# Patient Record
Sex: Female | Born: 1955
Health system: Southern US, Community
[De-identification: ages and names within clinical notes are randomized; demographics above are authoritative.]

## PROBLEM LIST (undated history)

## (undated) DIAGNOSIS — E785 Hyperlipidemia, unspecified: Secondary | ICD-10-CM

## (undated) DIAGNOSIS — J45909 Unspecified asthma, uncomplicated: Secondary | ICD-10-CM

## (undated) DIAGNOSIS — I10 Essential (primary) hypertension: Secondary | ICD-10-CM

## (undated) HISTORY — PX: CHOLECYSTECTOMY: SHX55

## (undated) HISTORY — PX: ABDOMINAL HYSTERECTOMY: SHX81

---

## 1997-11-19 ENCOUNTER — Inpatient Hospital Stay (HOSPITAL_COMMUNITY): Admission: RE | Admit: 1997-11-19 | Discharge: 1997-11-21 | Payer: Self-pay | Admitting: Obstetrics and Gynecology

## 1999-06-16 ENCOUNTER — Other Ambulatory Visit: Admission: RE | Admit: 1999-06-16 | Discharge: 1999-06-16 | Payer: Self-pay | Admitting: Obstetrics and Gynecology

## 1999-11-25 ENCOUNTER — Encounter: Payer: Self-pay | Admitting: Obstetrics and Gynecology

## 1999-11-25 ENCOUNTER — Ambulatory Visit (HOSPITAL_COMMUNITY): Admission: RE | Admit: 1999-11-25 | Discharge: 1999-11-25 | Payer: Self-pay | Admitting: Obstetrics and Gynecology

## 1999-12-02 ENCOUNTER — Other Ambulatory Visit: Admission: RE | Admit: 1999-12-02 | Discharge: 1999-12-02 | Payer: Self-pay | Admitting: Obstetrics and Gynecology

## 2000-06-20 ENCOUNTER — Other Ambulatory Visit: Admission: RE | Admit: 2000-06-20 | Discharge: 2000-06-20 | Payer: Self-pay | Admitting: Obstetrics and Gynecology

## 2000-11-28 ENCOUNTER — Encounter: Payer: Self-pay | Admitting: Obstetrics and Gynecology

## 2000-11-28 ENCOUNTER — Ambulatory Visit (HOSPITAL_COMMUNITY): Admission: RE | Admit: 2000-11-28 | Discharge: 2000-11-28 | Payer: Self-pay | Admitting: Obstetrics and Gynecology

## 2001-07-24 ENCOUNTER — Other Ambulatory Visit: Admission: RE | Admit: 2001-07-24 | Discharge: 2001-07-24 | Payer: Self-pay | Admitting: Obstetrics and Gynecology

## 2001-12-04 ENCOUNTER — Ambulatory Visit (HOSPITAL_COMMUNITY): Admission: RE | Admit: 2001-12-04 | Discharge: 2001-12-04 | Payer: Self-pay | Admitting: Obstetrics and Gynecology

## 2001-12-04 ENCOUNTER — Encounter: Payer: Self-pay | Admitting: Obstetrics and Gynecology

## 2002-12-12 ENCOUNTER — Ambulatory Visit (HOSPITAL_COMMUNITY): Admission: RE | Admit: 2002-12-12 | Discharge: 2002-12-12 | Payer: Self-pay | Admitting: Obstetrics and Gynecology

## 2002-12-12 ENCOUNTER — Encounter: Payer: Self-pay | Admitting: Obstetrics and Gynecology

## 2003-02-10 ENCOUNTER — Encounter (INDEPENDENT_AMBULATORY_CARE_PROVIDER_SITE_OTHER): Payer: Self-pay | Admitting: Specialist

## 2003-02-10 ENCOUNTER — Ambulatory Visit (HOSPITAL_COMMUNITY): Admission: RE | Admit: 2003-02-10 | Discharge: 2003-02-10 | Payer: Self-pay | Admitting: Gastroenterology

## 2003-02-18 ENCOUNTER — Encounter (INDEPENDENT_AMBULATORY_CARE_PROVIDER_SITE_OTHER): Payer: Self-pay | Admitting: Specialist

## 2003-02-18 ENCOUNTER — Ambulatory Visit (HOSPITAL_COMMUNITY): Admission: RE | Admit: 2003-02-18 | Discharge: 2003-02-18 | Payer: Self-pay | Admitting: Gastroenterology

## 2004-01-08 ENCOUNTER — Ambulatory Visit (HOSPITAL_COMMUNITY): Admission: RE | Admit: 2004-01-08 | Discharge: 2004-01-08 | Payer: Self-pay | Admitting: Family Medicine

## 2004-06-07 ENCOUNTER — Ambulatory Visit (HOSPITAL_COMMUNITY): Admission: RE | Admit: 2004-06-07 | Discharge: 2004-06-07 | Payer: Self-pay | Admitting: Gastroenterology

## 2004-06-07 ENCOUNTER — Encounter (INDEPENDENT_AMBULATORY_CARE_PROVIDER_SITE_OTHER): Payer: Self-pay | Admitting: Specialist

## 2005-02-08 ENCOUNTER — Ambulatory Visit (HOSPITAL_COMMUNITY): Admission: RE | Admit: 2005-02-08 | Discharge: 2005-02-08 | Payer: Self-pay | Admitting: Family Medicine

## 2005-09-23 ENCOUNTER — Ambulatory Visit (HOSPITAL_COMMUNITY): Admission: RE | Admit: 2005-09-23 | Discharge: 2005-09-23 | Payer: Self-pay | Admitting: Emergency Medicine

## 2006-02-14 ENCOUNTER — Ambulatory Visit (HOSPITAL_COMMUNITY): Admission: RE | Admit: 2006-02-14 | Discharge: 2006-02-14 | Payer: Self-pay | Admitting: Family Medicine

## 2006-08-08 ENCOUNTER — Ambulatory Visit (HOSPITAL_COMMUNITY): Admission: RE | Admit: 2006-08-08 | Discharge: 2006-08-08 | Payer: Self-pay | Admitting: Gastroenterology

## 2006-08-08 ENCOUNTER — Encounter (INDEPENDENT_AMBULATORY_CARE_PROVIDER_SITE_OTHER): Payer: Self-pay | Admitting: *Deleted

## 2006-09-29 ENCOUNTER — Ambulatory Visit (HOSPITAL_COMMUNITY): Admission: RE | Admit: 2006-09-29 | Discharge: 2006-09-29 | Payer: Self-pay | Admitting: Allergy and Immunology

## 2006-12-22 ENCOUNTER — Ambulatory Visit (HOSPITAL_COMMUNITY): Admission: RE | Admit: 2006-12-22 | Discharge: 2006-12-22 | Payer: Self-pay | Admitting: Family Medicine

## 2007-03-07 ENCOUNTER — Ambulatory Visit (HOSPITAL_COMMUNITY): Admission: RE | Admit: 2007-03-07 | Discharge: 2007-03-07 | Payer: Self-pay | Admitting: Family Medicine

## 2007-05-11 ENCOUNTER — Encounter (INDEPENDENT_AMBULATORY_CARE_PROVIDER_SITE_OTHER): Payer: Self-pay | Admitting: Gastroenterology

## 2007-05-11 ENCOUNTER — Ambulatory Visit (HOSPITAL_COMMUNITY): Admission: RE | Admit: 2007-05-11 | Discharge: 2007-05-11 | Payer: Self-pay | Admitting: Gastroenterology

## 2008-05-16 ENCOUNTER — Ambulatory Visit (HOSPITAL_COMMUNITY): Admission: RE | Admit: 2008-05-16 | Discharge: 2008-05-16 | Payer: Self-pay | Admitting: Family Medicine

## 2009-04-04 ENCOUNTER — Emergency Department (HOSPITAL_COMMUNITY): Admission: EM | Admit: 2009-04-04 | Discharge: 2009-04-04 | Payer: Self-pay | Admitting: Family Medicine

## 2009-05-29 ENCOUNTER — Ambulatory Visit (HOSPITAL_COMMUNITY): Admission: RE | Admit: 2009-05-29 | Discharge: 2009-05-29 | Payer: Self-pay | Admitting: Family Medicine

## 2010-07-21 ENCOUNTER — Ambulatory Visit (HOSPITAL_COMMUNITY): Admission: RE | Admit: 2010-07-21 | Discharge: 2010-07-21 | Payer: Self-pay | Admitting: Family Medicine

## 2010-08-03 ENCOUNTER — Encounter: Admission: RE | Admit: 2010-08-03 | Discharge: 2010-08-03 | Payer: Self-pay | Admitting: Family Medicine

## 2010-11-06 ENCOUNTER — Encounter: Payer: Self-pay | Admitting: Family Medicine

## 2010-11-08 ENCOUNTER — Encounter: Payer: Self-pay | Admitting: Family Medicine

## 2011-03-01 NOTE — Op Note (Signed)
NAMEZALA, DEGRASSE             ACCOUNT NO.:  [id number]   MEDICAL RECORD NO.:  [id number]          PATIENT TYPE:  AMB   LOCATION:  ENDO                         FACILITY:  [facility]   PHYSICIAN:  Petra Kuba, M.D.    DATE OF BIRTH:  Apr 04, 1956   DATE OF PROCEDURE:  05/11/2007  DATE OF DISCHARGE:                               OPERATIVE REPORT   PROCEDURE:  Colonoscopy with biopsy.   INDICATION:  The patient with personal history of carcinoid rectal tiny  tumor, family history of colon polyps in a brother at a young age.  Consent was signed after risks, benefits, methods, options thoroughly  discussed multiple times in the past.   MEDICINES USED FOR THIS PROCEDURE:  1. Fentanyl 125 mcg.  2. Versed 10 mg.   PROCEDURE:  Rectal inspection was pertinent for external hemorrhoids,  small.  Digital exam was negative.  The video colonoscope was inserted,  fairly easily advanced around the colon to the cecum.  This did require  some abdominal pressure but no position changes.  No abnormalities were  seen on insertion.  The cecum was identified by the appendiceal orifice  and the ileocecal valve.  In fact, the scope was inserted a short ways  into the terminal ileum which was normal.  Photodocumentation was  obtained.  The scope was slowly withdrawn.  The prep was adequate.  There was some liquid stool that required washing and suctioning.  On  slow withdrawal through the colon, no abnormalities were seen until we  withdrew to the proximal sigmoid where 2 tiny hyperplastic-appearing  polyps were seen and which were cold biopsied x1 and put in the second  container.  The scope was further withdrawn back to the rectum.  Scars  from previous polypectomy were seen but no other abnormalities.  Anorectal pull-through and retroflexion confirmed some small  hemorrhoids.  The scope was straightened and readvanced a short ways up  the left side of the colon; air was suctioned and scope removed.   The  patient tolerated the procedure well.  There was no obvious immediate  complication.   ENDOSCOPIC DIAGNOSES:  1. Internal-external small hemorrhoids.  2. Two tiny proximal sigmoid polyps, cold biopsied, hyperplastic-      appearing.  3. Otherwise within normal limits to the terminal ileum.   PLAN:  1. Await pathology, probably recheck in 5 years.  2. Continue workup with an EGD and Bravo pH capsule placement.           ______________________________  Petra Kuba, M.D.     MEM/MEDQ  D:  05/11/2007  T:  05/11/2007  Job:  [id number]   cc:   Otilio Connors. Gerri Spore, M.D.  Fax: [phone]   Micah Noel, [facility]

## 2011-03-01 NOTE — Op Note (Signed)
Kristy Colon, Kristy Colon             ACCOUNT NO.:  [id number]   MEDICAL RECORD NO.:  [id number]          PATIENT TYPE:  AMB   LOCATION:  ENDO                         FACILITY:  [facility]   PHYSICIAN:  Petra Kuba, M.D.    DATE OF BIRTH:  1955-12-22   DATE OF PROCEDURE:  DATE OF DISCHARGE:                               OPERATIVE REPORT   PROCEDURE:  Esophagogastroduodenoscopy with BRAVO pH capsule placement.   INDICATIONS:  Atypical reflux.   Consent was signed after risks, benefits, methods, options were  thoroughly discussed in the office.  Additional medicines for this  procedure, since it followed the colonoscopy, were fentanyl 25  micrograms, Versed 6 mg.   DESCRIPTION OF PROCEDURE:  The video endoscope was inserted by direct  vision.  The esophagus was normal.  She did have a small hiatal hernia.  Scope passed into the stomach, advanced to a normal antrum, normal, and  into a normal duodenal bulb and around the C-loop, past a normal ampulla  into the normal second portion of the duodenum.  The scope was slowly  withdrawn back to the bulb, which confirmed its normal appearance.  Scope was withdrawn back to the stomach, which was evaluated on straight  and retroflexed visualization, the hiatal hernia being confirmed in the  cardia.  The fundus, angularis, lesser and  greater curve were all  normal.  Straight visualization did not reveal any additional findings.  The scope was slowly withdrawn back to the upper esophageal sphincter.  The landmarks in the esophagus were confirmed.  We went ahead and  readvanced to the GE junction, again confirming the normal esophagus.  We went ahead and took our measurements.  The GE junction was right at  about 38 cm, so we marked the BRAVO capsule at 32 cm.  The scope was  removed.  The BRAVO capsule was placed by indirect vision in the  customary fashion.  Initially, it was probably placed in her lung, since  we induced coughing, and once that  occurred, it was quickly removed.  We  then replaced it seemingly into the esophagus and it was released in  customary fashion.  The scope was reinserted, which confirmed the proper  position, approximately 30 cm in the esophagus endoscopically.  The  scope was removed.  The patient tolerated the procedure well.  There was  no obvious immediate complication, although the BRAVO capsule may have  been placed into her lung, or at least into the larynx, which caused  some coughing for one short time.           ______________________________  Petra Kuba, M.D.     MEM/MEDQ  D:  05/11/2007  T:  05/11/2007  Job:  [id number]

## 2011-03-04 NOTE — Op Note (Signed)
Kristy Colon, Kristy Colon                       ACCOUNT NO.:  [id number]   MEDICAL RECORD NO.:  [id number]                   PATIENT TYPE:  AMB   LOCATION:  ENDO                                 FACILITY:  [facility]   PHYSICIAN:  Petra Kuba, M.D.                 DATE OF BIRTH:  08-25-1956   DATE OF PROCEDURE:  02/10/2003  DATE OF DISCHARGE:                                 OPERATIVE REPORT   PROCEDURE:  Colonoscopy with hot biopsy.   INDICATIONS FOR PROCEDURE:  Bright red blood per rectum, family history of  colon polyps. Consent was signed after the risks, benefits, methods and  options were thoroughly discussed in the office.   MEDICATIONS:  1. Demerol 100.  2. Versed 10.   DESCRIPTION OF PROCEDURE:  A rectal inspection was pertinent for external  hemorrhoids. A small digital examination was negative. The video adjustable  colonoscope was inserted and advanced around the colon to the cecum. This  did require rolling her on her back and some abdominal pressure.   No obvious abnormality was seen on insertion. The cecum was identified by  the appendiceal orifice and the ileocecal valve. The prep was fairly  adequate. She did require some washing and suctioning for adequate  visualization.   On slow withdrawal through the colon the cecum, ascending, transverse and  descending colon were normal. At the descending sigmoid junction a small  polyp was seen  and was hot biopsied x1. Also near the distal sigmoid  another small polyp was seen  and was hot biopsied x1 and in the rectum a  third tiny one was hot biopsied. All polyps were put in the same container.   The scope was retroflexed and pertinent for some internal hemorrhoids. The  scope was straightened and readvanced a short way and sent to the left side  of the colon. Air was suctioned and the scope was removed.   The patient tolerated the procedure well. There were no obvious immediate  complications.   ENDOSCOPIC  DIAGNOSES:  1. Internal and external hemorrhoids.  2. Three small rectal and sigmoid polyps, hot biopsied.  3. Otherwise within normal limits to the cecum.   PLAN:  1. Await pathology to return to plan future colonic screening.  2.     Treat the hemorrhoids, causes of bleeding.  3. Have her to see me back p.r.n. otherwise return to Dr. Clyde Canterbury for the     customary health maintenance including yearly rectals and Guaiacs.                                               Petra Kuba, M.D.    MEM/MEDQ  D:  02/10/2003  T:  02/10/2003  Job:  [id number]   cc:   Otilio Connors.  Gerri Spore, M.D.  [address]  [place]  [place] [address]  Fax: [phone]

## 2011-03-04 NOTE — Op Note (Signed)
Kristy Colon, LANINGHAM             ACCOUNT NO.:  [id number]   MEDICAL RECORD NO.:  [id number]          PATIENT TYPE:  AMB   LOCATION:  ENDO                         FACILITY:  [facility]   PHYSICIAN:  Petra Kuba, M.D.    DATE OF BIRTH:  06/07/1956   DATE OF PROCEDURE:  08/08/2006  DATE OF DISCHARGE:                                 OPERATIVE REPORT   PROCEDURE:  Esophagogastroduodenoscopy with biopsy.   INDICATIONS:  Questionable atypical reflux.   CONSENT:  Consent was signed after risks, benefits, methods, options  thoroughly discussed in the office.   MEDICATIONS:  Fentanyl 100 mcg, Versed 8 mg.   PROCEDURE:  The video endoscope was inserted by direct vision.  Esophagus  was normal.  Quick look at the vocal cords was normal.  Scope passed in the  stomach where a little bit of old food was seen and advanced through a  normal antrum, normal pylorus and into a normal duodenal bulb and around the  C-loop to a normal second portion of the duodenum.  The scope was slowly  withdrawn, again a good look at the bulb was normal.  Scope was drawn back  to stomach and retroflexed.  Cardia, fundus, angularis, lesser and greater  curve were all normal except for the area underneath the proximal greater  curve that had a little bit of food.  We could wash and suction some of it.  No other abnormalities were seen.  Straight visualization of the stomach did  not reveal any additional findings.  Air was suctioned. Scope was slowly  withdrawn.  Again a good look at the esophagus was normal.  No hiatal hernia  was seen.  Went ahead and took a few biopsies of the distal and mid-  esophagus to rule out any microscopic reflux changes.  Scope was further  withdrawn.  No other abnormalities were seen.  The patient tolerated the  procedure well.  There was no obvious immediate complication.   ENDOSCOPIC DIAGNOSES:  1. Patent gastroesophageal junction without obvious hiatal hernia.  2. Little bit of old  food in the stomach.  3. Otherwise normal esophagogastroduodenoscopy, status post esophageal      biopsies to rule out microscope reflux changes.   PLAN:  Await pathology.  Consider a gastric emptying study next and possibly  a 24-hour pH off of medicines. Bravo capsule to confirm the diagnosis.           ______________________________  Petra Kuba, M.D.     MEM/MEDQ  D:  08/08/2006  T:  08/09/2006  Job:  [id number]   cc:   Otilio Connors. Gerri Spore, M.D.

## 2011-03-04 NOTE — Op Note (Signed)
NAMEHELAINA, STEFANO             ACCOUNT NO.:  [id number]   MEDICAL RECORD NO.:  [id number]          PATIENT TYPE:  AMB   LOCATION:  ENDO                         [facility]ACILITY:  [facility]   PHYSICIAN:  Petra Kuba, M.D.    DATE OF BIRTH:  18-Mar-1956   DATE OF PROCEDURE:  DATE OF DISCHARGE:  05/11/2007                               OPERATIVE REPORT   PROCEDURE:  Bravo pH capsule.   INDICATIONS:  Patient with atypical reflux, want to evaluate.   PROCEDURE:  The Bravo capsule was placed at the time of endoscopy and  the computer-generated results were given to me for my interpretation.   ASSESSMENT:  On day one, she had just a slightly high total DeMeester  score of 19.1 with normal being less than 14.72.  Interesting, most of  her reflux was in the upright position with very little in the supine.  Interesting, on day two of the monitor, she had a normal amount of  reflux with a DeMeester total score of 13.3, although I do not think she  was taking her Nexium, she did not stop for three days.  Her total  fraction of the pH less than 4 was in the normal range with a total of  5.3%.  As far as the _________ of symptoms and reflux, there did seem to  be some concordance.   IMPRESSION:  Probable mild reflux overall with some concordant symptoms.   PLAN:  We will continue pump inhibitors.  Although I am not sure yet if  fixing her hiatal hernia will decrease some of her atypical symptoms, we  can discuss this on followup.           ______________________________  Petra Kuba, M.D.     MEM/MEDQ  D:  05/15/2007  T:  05/16/2007  Job:  [id number]

## 2011-03-04 NOTE — Op Note (Signed)
Kristy Colon, Kristy Colon                       ACCOUNT NO.:  [id number]   MEDICAL RECORD NO.:  [id number]                   PATIENT TYPE:  AMB   LOCATION:  ENDO                                 FACILITY:  [facility]   PHYSICIAN:  Petra Kuba, M.D.                 DATE OF BIRTH:  12-09-1955   DATE OF PROCEDURE:  02/18/2003  DATE OF DISCHARGE:                                 OPERATIVE REPORT   PROCEDURE:  Flexible sigmoidoscopy with hot biopsy.   INDICATIONS:  Patient with three tiny to small polyps on recent colonoscopy,  one pertinent for being a small carcinoid.  Want to re-evaluate her  polypectomy sites and re-biopsy them to confirm complete excision.  Consent  was signed after risks, benefits, methods, and options thoroughly discussed  by both my nurse and I on the phone.   MEDICATIONS:  Demerol 80 mg, Versed 8 mg.   DESCRIPTION OF PROCEDURE:  Rectal inspection was pertinent for small  external hemorrhoids.  Digital exam was negative.  The pediatric video  adjustable colonoscope was inserted and easily advanced to 70 cm.  On  insertion the distal rectal and rectosigmoid junction polypectomy sites were  seen, but the third one was not seen.  At 70 cm it appeared that we were at  the splenic flexure.  We elected to withdraw.  The prep was adequate.  There  was some liquid stool that required washing and suctioning.  At the sigmoid-  descending junction, a possible polypectomy site was seen with a tiny little  bit of erythema, and it was hot biopsied x1.  Retrospectively, as we  withdrew this was probably not a previous polypectomy site.  On slow  withdrawal through the sigmoid, a tiny polyp was seen and it was hot  biopsied as well and put in the second container.  We then withdrew through  the midsigmoid.  An obvious polypectomy site was seen.  There was a nice  white coagulum without any obvious polyp remaining, and we took three hot  biopsies of the edge of this and put them in  the third container.  At the  rectosigmoid junction one of the polypectomy sites previously seen was hot  biopsied x3 and put in the fourth container.  The scope was withdrawn to the  distal rectal polypectomy site and was hot biopsied x2.  No obvious residual  polyp was seen on this one as well.  The scope was retroflexed, revealing  some internal hemorrhoids.  We could see the most distal polypectomy site on  retroflexion.  The scope was straightened and readvanced a short way up the  left side of the colon, air was suctioned, scope removed.  The patient  tolerated the procedure well.  There was no obvious immediate complication.   ENDOSCOPIC DIAGNOSES:  1. Internal and external hemorrhoids.  2. All three polypectomy sites found without obvious residual polyp, all hot  biopsied.  3. Questionable two other descending and sigmoid tiny polyps, which were     also hot biopsied and put in separate containers as well.  4. Otherwise within normal limits to approximately the splenic flexure.    PLAN:  1. Await pathology to determine future colonic screening.  2. Follow up p.r.n. or in two months to recheck symptoms and make sure no     further workup plans are needed.                                               Petra Kuba, M.D.    MEM/MEDQ  D:  02/18/2003  T:  02/18/2003  Job:  [id number]   cc:   Otilio Connors. Gerri Spore, M.D.  [address]  [place]  [place] [address]  Fax: [phone]

## 2011-03-04 NOTE — Op Note (Signed)
NAMEHIBO, BLASDELL                       ACCOUNT NO.:  [id number]   MEDICAL RECORD NO.:  [id number]                   PATIENT TYPE:  AMB   LOCATION:  ENDO                                 FACILITY:  [facility]   PHYSICIAN:  Petra Kuba, M.D.                 DATE OF BIRTH:  1956-10-16   DATE OF PROCEDURE:  06/07/2004  DATE OF DISCHARGE:                                 OPERATIVE REPORT   PROCEDURE:  Colonoscopy.   INDICATIONS FOR PROCEDURE:  Patient with carcinoid tumor on previous  colonoscopy with colon polyps.  Consent was signed after risks, benefits,  methods, and options were thoroughly discussed in the office on multiple  occasions.   MEDICATIONS USED:  Demerol 100, Versed 8.   PROCEDURE:  Rectal inspection was pertinent for external hemorrhoids.  Digital exam was negative.  The video pediatric adjustable colonoscope was  inserted and with abdominal pressure, able to be advanced through the colon  to the cecum.  This did not require any position changes.  No abnormality  was seen on insertion.  The scope was inserted a short ways into the  terminal ileum which was normal.  Photodocumentation was obtained.  The  scope was slowly withdrawn.  On slow withdrawal through the colon, the prep  was adequate, there was some liquid stool that required washing and  suctioning.  The cecum, ascending, transverse, and descending were normal.  The scope was withdrawn around the sigmoid.  A few tiny probably  hyperplastic appearing polyps were seen and were all hot biopsied.  Two in  the right colon were hot biopsied, as well.  No significant lesions were  seen.  Anal and rectal pull through and retroflexion confirmed small  hemorrhoids.  The scope was reinserted a short ways up the left side of the  colon, air was suctioned, the scope was removed.  The patient tolerated the  procedure well.  There was no obvious complications.   ENDOSCOPIC DIAGNOSIS:  1. Internal and external  hemorrhoids.  2. A few tiny probable hyperplastic appearing rectal and sigmoid polyps, hot     biopsied.  3. Otherwise, within normal limits to the terminal ileum.   PLAN:  Await pathology to determine future colonic screening.  Happy to see  back p.r.n., otherwise, return care to Dr. Gerri Spore for the customary  health care maintenance to include yearly rectals and guaiacs.                                               Petra Kuba, M.D.    MEM/MEDQ  D:  06/07/2004  T:  06/07/2004  Job:  [id number]

## 2011-08-22 ENCOUNTER — Other Ambulatory Visit (HOSPITAL_COMMUNITY): Payer: Self-pay | Admitting: Family Medicine

## 2011-08-22 DIAGNOSIS — Z1231 Encounter for screening mammogram for malignant neoplasm of breast: Secondary | ICD-10-CM

## 2011-09-14 ENCOUNTER — Other Ambulatory Visit: Payer: Self-pay | Admitting: Obstetrics and Gynecology

## 2011-09-20 ENCOUNTER — Ambulatory Visit (HOSPITAL_COMMUNITY)
Admission: RE | Admit: 2011-09-20 | Discharge: 2011-09-20 | Disposition: A | Discharge status: Home / Self Care | Payer: 59 | Source: Ambulatory Visit | Attending: Family Medicine | Admitting: Family Medicine

## 2011-09-20 DIAGNOSIS — Z1231 Encounter for screening mammogram for malignant neoplasm of breast: Secondary | ICD-10-CM | POA: Insufficient documentation

## 2011-09-26 ENCOUNTER — Other Ambulatory Visit: Payer: Self-pay | Admitting: Family Medicine

## 2011-09-26 DIAGNOSIS — R928 Other abnormal and inconclusive findings on diagnostic imaging of breast: Secondary | ICD-10-CM

## 2011-10-13 ENCOUNTER — Ambulatory Visit
Admission: RE | Admit: 2011-10-13 | Discharge: 2011-10-13 | Disposition: A | Discharge status: Home / Self Care | Payer: 59 | Source: Ambulatory Visit | Attending: Family Medicine | Admitting: Family Medicine

## 2011-10-13 DIAGNOSIS — R928 Other abnormal and inconclusive findings on diagnostic imaging of breast: Secondary | ICD-10-CM

## 2012-05-18 ENCOUNTER — Observation Stay (HOSPITAL_COMMUNITY)
Admission: AD | Admit: 2012-05-18 | Discharge: 2012-05-19 | Disposition: A | Discharge status: Home / Self Care | Payer: 59 | Source: Ambulatory Visit | Attending: Emergency Medicine | Admitting: Emergency Medicine

## 2012-05-18 ENCOUNTER — Encounter (HOSPITAL_COMMUNITY): Payer: Self-pay

## 2012-05-18 ENCOUNTER — Inpatient Hospital Stay (HOSPITAL_COMMUNITY): Payer: 59

## 2012-05-18 DIAGNOSIS — R0602 Shortness of breath: Secondary | ICD-10-CM | POA: Insufficient documentation

## 2012-05-18 DIAGNOSIS — J45909 Unspecified asthma, uncomplicated: Secondary | ICD-10-CM | POA: Insufficient documentation

## 2012-05-18 DIAGNOSIS — R079 Chest pain, unspecified: Principal | ICD-10-CM | POA: Insufficient documentation

## 2012-05-18 DIAGNOSIS — R42 Dizziness and giddiness: Secondary | ICD-10-CM | POA: Insufficient documentation

## 2012-05-18 DIAGNOSIS — R110 Nausea: Secondary | ICD-10-CM | POA: Insufficient documentation

## 2012-05-18 DIAGNOSIS — E785 Hyperlipidemia, unspecified: Secondary | ICD-10-CM | POA: Insufficient documentation

## 2012-05-18 HISTORY — DX: Hyperlipidemia, unspecified: E78.5

## 2012-05-18 HISTORY — DX: Unspecified asthma, uncomplicated: J45.909

## 2012-05-18 HISTORY — DX: Essential (primary) hypertension: I10

## 2012-05-18 LAB — CBC WITH DIFFERENTIAL/PLATELET
Basophils Absolute: 0 10*3/uL (ref 0.0–0.1)
Basophils Relative: 0 % (ref 0–1)
Eosinophils Absolute: 0.5 10*3/uL (ref 0.0–0.7)
Eosinophils Relative: 5 % (ref 0–5)
HCT: 37.2 % (ref 36.0–46.0)
Hemoglobin: 13.3 g/dL (ref 12.0–15.0)
Lymphocytes Relative: 23 % (ref 12–46)
Lymphs Abs: 2 10*3/uL (ref 0.7–4.0)
MCH: 32.7 pg (ref 26.0–34.0)
MCHC: 35.8 g/dL (ref 30.0–36.0)
MCV: 91.4 fL (ref 78.0–100.0)
Monocytes Absolute: 0.4 10*3/uL (ref 0.1–1.0)
Monocytes Relative: 5 % (ref 3–12)
Neutro Abs: 6 10*3/uL (ref 1.7–7.7)
Neutrophils Relative %: 67 % (ref 43–77)
Platelets: 320 10*3/uL (ref 150–400)
RBC: 4.07 MIL/uL (ref 3.87–5.11)
RDW: 12.5 % (ref 11.5–15.5)
WBC: 8.9 10*3/uL (ref 4.0–10.5)

## 2012-05-18 LAB — COMPREHENSIVE METABOLIC PANEL
ALT: 7 U/L (ref 0–35)
AST: 14 U/L (ref 0–37)
Albumin: 3.6 g/dL (ref 3.5–5.2)
Alkaline Phosphatase: 95 U/L (ref 39–117)
BUN: 13 mg/dL (ref 6–23)
CO2: 27 mEq/L (ref 19–32)
Calcium: 9.5 mg/dL (ref 8.4–10.5)
Chloride: 100 mEq/L (ref 96–112)
Creatinine, Ser: 0.86 mg/dL (ref 0.50–1.10)
GFR calc Af Amer: 87 mL/min — ABNORMAL LOW (ref >90)
GFR calc non Af Amer: 75 mL/min — ABNORMAL LOW (ref >90)
Glucose, Bld: 113 mg/dL — ABNORMAL HIGH (ref 70–99)
Potassium: 3.2 mEq/L — ABNORMAL LOW (ref 3.5–5.1)
Sodium: 138 mEq/L (ref 135–145)
Total Bilirubin: 0.2 mg/dL — ABNORMAL LOW (ref 0.3–1.2)
Total Protein: 6.8 g/dL (ref 6.0–8.3)

## 2012-05-18 LAB — POCT I-STAT TROPONIN I
Troponin i, poc: 0 ng/mL (ref 0.00–0.08)
Troponin i, poc: 0.01 ng/mL (ref 0.00–0.08)

## 2012-05-18 MED ORDER — ATORVASTATIN CALCIUM 20 MG PO TABS: 20.0000 mg | ORAL_TABLET | Freq: Every day | ORAL | Status: DC

## 2012-05-18 MED ORDER — FLUOXETINE HCL 20 MG PO CAPS: 20.0000 mg | ORAL_CAPSULE | Freq: Every day | ORAL | Status: DC

## 2012-05-18 NOTE — ED Notes (Signed)
Per EMS -[facility]  [facility] her lopressor 5, morphine 5, ASA 325. Midsternal radiating to the back.  Pt denies n/v/d.  EKG did not show anything significant.  Pain pressure 10/10.  [facility]  [facility]S BP 170/90.  2 Nitros given to her by EMS, pain 0/10.  Last BP 138/60.

## 2012-05-18 NOTE — ED Provider Notes (Signed)
History     CSN: [id number]  Arrival date & time 05/18/12  1516   First MD Initiated Contact with Patient 05/18/12 1651      Chief Complaint  Patient presents with  . Chest Pain    (Consider location/radiation/quality/duration/timing/severity/associated sxs/prior treatment) Patient is a 56 y.o. female presenting with chest pain. The history is provided by the patient.  Chest Pain The chest pain began 3 - 5 hours ago. Duration of episode(s) is 2 hours. Episode frequency: once. The chest pain is resolved. At its most intense, the pain is at 10/10. The pain is currently at 0/10. The quality of the pain is described as pressure-like. The pain radiates to the upper back. Primary symptoms include shortness of breath, nausea and dizziness. Pertinent negatives for primary symptoms include no fever, no syncope, no wheezing, no palpitations, no abdominal pain and no vomiting.  Dizziness also occurs with nausea. Dizziness does not occur with vomiting, weakness or diaphoresis.   Pertinent negatives for associated symptoms include no diaphoresis, no near-syncope, no numbness, no orthopnea and no weakness. She tried nitroglycerin and aspirin for the symptoms.  Her past medical history is significant for hypertension.  Pertinent negatives for family medical history include: no early MI in family.     Past Medical History  Diagnosis Date  . Asthma   . Hypertension   . Hyperlipemia     Past Surgical History  Procedure Date  . Cholecystectomy   . Abdominal hysterectomy     Family History  Problem Relation Age of Onset  . Diabetes Other   . Cancer Other     History  Substance Use Topics  . Smoking status: Never Smoker   . Smokeless tobacco: Not on file  . Alcohol Use: No    OB History    Grav Para Term Preterm Abortions TAB SAB Ect Mult Living                  Review of Systems  Constitutional: Negative for fever and diaphoresis.  HENT: Negative.   Respiratory: Positive for  shortness of breath. Negative for wheezing.   Cardiovascular: Positive for chest pain. Negative for palpitations, orthopnea, syncope and near-syncope.  Gastrointestinal: Positive for nausea. Negative for vomiting, abdominal pain and diarrhea.  Genitourinary: Negative.   Musculoskeletal: Negative.   Skin: Negative.   Neurological: Positive for dizziness. Negative for syncope, weakness and numbness.  All other systems reviewed and are negative.    Allergies  Review of patient's allergies indicates not on file.  Home Medications  No current outpatient prescriptions on file.  BP 129/57  Pulse 77  Temp 97.7 F (36.5 C) (Oral)  Resp 18  SpO2 98%  Physical Exam  Nursing note and vitals reviewed. Constitutional: She is oriented to person, place, and time. She appears well-developed and well-nourished. No distress.  HENT:  Head: Normocephalic and atraumatic.  Eyes: Conjunctivae are normal.  Neck: Neck supple.  Cardiovascular: Normal rate, regular rhythm, normal heart sounds and intact distal pulses.   No murmur heard. Pulmonary/Chest: Effort normal and breath sounds normal. She has no wheezes. She has no rales.  Abdominal: Soft. She exhibits no distension. There is no tenderness.  Musculoskeletal: Normal range of motion.  Neurological: She is alert and oriented to person, place, and time.  Skin: Skin is warm and dry.    ED Course  Procedures (including critical care time)  Labs Reviewed - No data to display No results found.   1. Chest pain  MDM  56 yo female with PMHx of HTN, HLD presents with chest pressure radiating to the back that started 3 hrs prior to arrival.  Sx started after pt ate a large lunch.  She took Prilosec as she thought this might be heartburn without relief.  At [facility] hospital BP reported to be 200/110.  Pt had associated sx of lightheadedness and nausea.  BP 170/90 on EMS arrival.  Pt given ASA 324 mg and Nitro x2 with complete resolution of chest  pressure.  BP on arrival to ED 129/57.  AF, VSS NAD at time of exam.  Pain free.  Will get labs including cardiac enzymes and CXR.    Troponin negative.  CXR w/o acute cardiopulmonary abnormality.  Pt remains pain free in the ED. TIMI 0.  PERC negative and no signs of PE.  Pt discussed with Cardiology and they will arrange for stress testing early next week.  Tx plan discussed with pt who agreed with plan.  Return precautions provided.       Cherre Robins, MD 05/19/12 [phone]

## 2012-05-18 NOTE — Progress Notes (Signed)
Kristy Colon was taken from her office to [facility] to have her VS taken- pulse 96 and regular, skin warm and dry, resp = 20, BP 180/100. Her main c/o was chest pain & headache. Tytionna stated she thought she had heartburn and was "stressed out". Color was normal, pain was relieved after she was lying down from a 10 to a 7. EKG was done and abnormal report was given to Dr. Sheral Apley, who ordered meds, labs after speaking with ER doctor at [facility].EMS was called and pt was transferred to [facility] ED.

## 2012-05-18 NOTE — ED Notes (Signed)
Patient transported to X-ray 

## 2012-05-18 NOTE — Progress Notes (Signed)
1500.. Iv started 20gauge in left hand. Per Dr. Brayton Caves 5mg  labetolol  iv given slow push, 325mg  asa given po, 5mg  morphine iv slow push given.. All this done per [facility]  Short Stay Unit

## 2012-05-19 LAB — POCT I-STAT TROPONIN I: Troponin i, poc: 0 ng/mL (ref 0.00–0.08)

## 2012-05-19 NOTE — ED Notes (Signed)
BMI: 38.1 from actual height and weight obtained upon pt arrival to [facility]U.

## 2012-05-19 NOTE — ED Provider Notes (Signed)
I saw and evaluated the patient, reviewed the resident's note and I agree with the findings and plan.   Dazha Kempa, MD 05/19/12 1710 

## 2012-05-29 ENCOUNTER — Ambulatory Visit (HOSPITAL_COMMUNITY): Payer: 59 | Attending: Cardiovascular Disease | Admitting: Radiology

## 2012-05-29 VITALS — BP 120/79 | Ht 66.0 in | Wt 238.0 lb

## 2012-05-29 DIAGNOSIS — R0602 Shortness of breath: Secondary | ICD-10-CM | POA: Insufficient documentation

## 2012-05-29 DIAGNOSIS — R55 Syncope and collapse: Secondary | ICD-10-CM | POA: Insufficient documentation

## 2012-05-29 DIAGNOSIS — R079 Chest pain, unspecified: Secondary | ICD-10-CM | POA: Insufficient documentation

## 2012-05-29 DIAGNOSIS — R000 Tachycardia, unspecified: Secondary | ICD-10-CM | POA: Insufficient documentation

## 2012-05-29 DIAGNOSIS — E785 Hyperlipidemia, unspecified: Secondary | ICD-10-CM | POA: Insufficient documentation

## 2012-05-29 DIAGNOSIS — R5381 Other malaise: Secondary | ICD-10-CM | POA: Insufficient documentation

## 2012-05-29 DIAGNOSIS — R5383 Other fatigue: Secondary | ICD-10-CM | POA: Insufficient documentation

## 2012-05-29 DIAGNOSIS — I10 Essential (primary) hypertension: Secondary | ICD-10-CM | POA: Insufficient documentation

## 2012-05-29 DIAGNOSIS — R0609 Other forms of dyspnea: Secondary | ICD-10-CM | POA: Insufficient documentation

## 2012-05-29 DIAGNOSIS — R0989 Other specified symptoms and signs involving the circulatory and respiratory systems: Secondary | ICD-10-CM | POA: Insufficient documentation

## 2012-05-29 DIAGNOSIS — R42 Dizziness and giddiness: Secondary | ICD-10-CM | POA: Insufficient documentation

## 2012-05-29 DIAGNOSIS — R110 Nausea: Secondary | ICD-10-CM | POA: Insufficient documentation

## 2012-05-29 DIAGNOSIS — Z87891 Personal history of nicotine dependence: Secondary | ICD-10-CM | POA: Insufficient documentation

## 2012-05-29 MED ORDER — TECHNETIUM TC 99M TETROFOSMIN IV KIT
30.0000 | PACK | Freq: Once | INTRAVENOUS | Status: AC | PRN
  Administered 2012-05-29: 30 via INTRAVENOUS

## 2012-05-29 MED ORDER — TECHNETIUM TC 99M TETROFOSMIN IV KIT
10.0000 | PACK | Freq: Once | INTRAVENOUS | Status: AC | PRN
  Administered 2012-05-29: 10 via INTRAVENOUS

## 2012-05-29 MED ORDER — REGADENOSON 0.4 MG/5ML IV SOLN
0.4000 mg | Freq: Once | INTRAVENOUS | Status: AC
  Administered 2012-05-29: 0.4 mg via INTRAVENOUS

## 2012-05-29 NOTE — Progress Notes (Signed)
[facility] SITE 3 NUCLEAR MED [address] [place] [address] [phone]  Cardiology Nuclear Med Study  Kristy Colon is a 56 y.o. female     MRN : [id number]     DOB: May 29, 1956  Procedure Date: 05/29/2012  Nuclear Med Background Indication for Stress Test:  Evaluation for Ischemia and 05/19/12 [facility] ED: Chest pain, (-) troponin History:  No prior known history of CAD Cardiac Risk Factors: History of Smoking, Hypertension and Lipids  Symptoms: Chest Pain without exertion, occurs after eating (last occurrence today),  Dizziness, DOE, Fatigue, Fatigue with Exertion, Nausea, Near Syncope, Rapid HR and SOB   Nuclear Pre-Procedure Caffeine/Decaff Intake:  None NPO After: 5:30pm   Lungs:  clear O2 Sat: 96% on room air. IV 0.9% NS with Angio Cath:  20g  IV Site: R Antecubital  IV Started by:  Stanton Kidney, EMT-P  Chest Size (in):  44 Cup Size: D  Height: 5\' 6"  (1.676 m)  Weight:  238 lb (107.956 kg)  BMI:  Body mass index is 38.41 kg/(m^2). Tech Comments:  NA    Nuclear Med Study 1 or 2 day study: 1 day  Stress Test Type:  Treadmill/Lexiscan  Reading MD: Charlton Haws, MD  Order Authorizing Provider:  Charlton Haws, MD  Resting Radionuclide: Technetium 66m Tetrofosmin  Resting Radionuclide Dose: 11.0 mCi   Stress Radionuclide:  Technetium 73m Tetrofosmin  Stress Radionuclide Dose: 33.0 mCi           Stress Protocol Rest HR: 75 Stress HR: 127  Rest BP: 120/79 Stress BP: 194/68  Exercise Time (min): 2:00 METS: n/a   Predicted Max HR: 166 bpm % Max HR: 76.51 bpm Rate Pressure Product: 21308   Dose of Adenosine (mg):  n/a Dose of Lexiscan: 0.4 mg  Dose of Atropine (mg): n/a Dose of Dobutamine: n/a mcg/kg/min (at max HR)  Stress Test Technologist: Irean Hong, RN  Nuclear Technologist:  Domenic Polite, CNMT     Rest Procedure:  Myocardial perfusion imaging was performed at rest 45 minutes following the intravenous administration of Technetium  71m Tetrofosmin. Rest ECG: NSR  Stress Procedure:  The patient received IV Lexiscan 0.4 mg over 15-seconds with concurrent low level exercise and then Technetium 51m Tetrofosmin was injected at 30-seconds while the patient continued walking one more minute. There were no significant changes with Lexiscan. The patient complained of chest pressure with Lexiscan. Quantitative spect images were obtained after a 45-minute delay. Stress ECG: No significant change from baseline ECG  QPS Raw Data Images:  Normal; no motion artifact; normal heart/lung ratio. Stress Images:  Normal homogeneous uptake in all areas of the myocardium. Rest Images:  Normal homogeneous uptake in all areas of the myocardium. Subtraction (SDS):  Normal Transient Ischemic Dilatation (Normal <1.22):  0.96 Lung/Heart Ratio (Normal <0.45):  0.31  Quantitative Gated Spect Images QGS EDV:  67 ml QGS ESV:  18 ml  Impression Exercise Capacity:  Lexiscan with low level exercise. BP Response:  Normal blood pressure response. Clinical Symptoms:  There is dyspnea. ECG Impression:  No significant ST segment change suggestive of ischemia. Comparison with Prior Nuclear Study: No previous nuclear study performed  Overall Impression:  Normal stress nuclear study.  LV Ejection Fraction: 73%.  LV Wall Motion:  NL LV Function; NL Wall Motion   Charlton Haws

## 2012-06-04 ENCOUNTER — Ambulatory Visit (INDEPENDENT_AMBULATORY_CARE_PROVIDER_SITE_OTHER): Payer: 59 | Admitting: Cardiovascular Disease

## 2012-06-04 ENCOUNTER — Encounter: Payer: Self-pay | Admitting: Cardiovascular Disease

## 2012-06-04 VITALS — BP 130/77 | HR 78 | Ht 66.0 in | Wt 241.0 lb

## 2012-06-04 DIAGNOSIS — G43909 Migraine, unspecified, not intractable, without status migrainosus: Secondary | ICD-10-CM

## 2012-06-04 DIAGNOSIS — G459 Transient cerebral ischemic attack, unspecified: Secondary | ICD-10-CM

## 2012-06-04 DIAGNOSIS — E785 Hyperlipidemia, unspecified: Secondary | ICD-10-CM

## 2012-06-04 DIAGNOSIS — J45909 Unspecified asthma, uncomplicated: Secondary | ICD-10-CM | POA: Insufficient documentation

## 2012-06-04 DIAGNOSIS — I10 Essential (primary) hypertension: Secondary | ICD-10-CM

## 2012-06-04 DIAGNOSIS — R079 Chest pain, unspecified: Secondary | ICD-10-CM | POA: Insufficient documentation

## 2012-06-04 MED ORDER — DIAZEPAM 5 MG PO TABS: ORAL_TABLET | ORAL | Status: DC

## 2012-06-04 MED ORDER — DIAZEPAM 5 MG PO TABS: 5.0000 mg | ORAL_TABLET | Freq: Four times a day (QID) | ORAL | Status: DC | PRN

## 2012-06-04 NOTE — Progress Notes (Signed)
Patient ID: Kristy Colon, female   DOB: 1956-05-30, 56 y.o.   MRN: [id number] 56 yo [employer] employee referred post ER visit for chest pain.  Long standing HTN on diuretic  Had migraine at work and then started feeling poorly with accelerated HTN In ER R/O and had resolution of chest pain  Hisotory of hiatal hernia and reflux in past on nexium  Sees Dr Ewing Schlein.  Longstanding history of migraines since age 89.  Vision in left eye is lost for 15 minutes or so.  Takes excedrin migraine and sits in quiet room for relief. No nausea, headache or other focal neuro signs.  She F/U in our office with stress myovue which I reviewed from 05/30/12 whitch was normal with no ishcemia or infarct and normal EF.  Primary is [place]  Follows BP.  Just had eyes dilated and examined by Nile Riggs for "membrane" abnormality Patient indicates no vascular issues  ROS: Denies fever, malais, weight loss, blurry vision, decreased visual acuity, cough, sputum, SOB, hemoptysis, pleuritic pain, palpitaitons, heartburn, abdominal pain, melena, lower extremity edema, claudication, or rash.  All other systems reviewed and negative   General: Affect appropriate Healthy:  appears stated age HEENT: normal Neck supple with no adenopathy JVP normal no bruits no thyromegaly Lungs clear with no wheezing and good diaphragmatic motion Heart:  S1/S2 no murmur,rub, gallop or click PMI normal Abdomen: benighn, BS positve, no tenderness, no AAA no bruit.  No HSM or HJR Distal pulses intact with no bruits No edema Neuro non-focal Skin warm and dry No muscular weakness  Medications Current Outpatient Prescriptions  Medication Sig Dispense Refill  . clobetasol cream (TEMOVATE) 0.05 % Apply 1 application topically daily as needed. For lips per patient      . doxycycline (DORYX) 100 MG DR capsule Take 100 mg by mouth daily.       Marland Kitchen FLUoxetine (PROZAC) 20 MG capsule Take 20 mg by mouth daily.      . rosuvastatin (CRESTOR) 10 MG  tablet Take 10 mg by mouth daily.      Marland Kitchen triamterene-hydrochlorothiazide (DYAZIDE) 37.5-25 MG per capsule Take 1 capsule by mouth every morning.        Allergies Review of patient's allergies indicates no known allergies.  Family History: Family History  Problem Relation Age of Onset  . Diabetes Other   . Cancer Other     Social History: History   Social History  . Marital Status: Married    Spouse Name: N/A    Number of Children: N/A  . Years of Education: N/A   Occupational History  . Not on file.   Social History Main Topics  . Smoking status: Never Smoker   . Smokeless tobacco: Not on file  . Alcohol Use: No  . Drug Use:   . Sexually Active:    Other Topics Concern  . Not on file   Social History Narrative  . No narrative on file    Electrocardiogram:  NSR rate 84  Insignificant Q's in inferior leads  05/22/12    Assessment and Plan

## 2012-06-04 NOTE — Assessment & Plan Note (Signed)
Visual disturbance may be from complex migraine. Check carotids and MRI?MRA  F/U primary and see if she should be referred to headache specialist.

## 2012-06-04 NOTE — Assessment & Plan Note (Signed)
Labile. Worse with migraine. Continue diuretic  Encouraged her to get BP cuff and check at home  F/U primary

## 2012-06-04 NOTE — Assessment & Plan Note (Signed)
Noncardiac  R/O and normal myovue  F/U Magod with history of hiatal hernia and reflux

## 2012-06-04 NOTE — Assessment & Plan Note (Signed)
Cholesterol is at goal.  Continue current dose of statin and diet Rx.  No myalgias or side effects.  F/U  LFT's in 6 months. No results found for this basename: LDLCALC             

## 2012-06-04 NOTE — Patient Instructions (Signed)
Your physician recommends that you schedule a follow-up appointment in: AS NEEDED Your physician recommends that you continue on your current medications as directed. Please refer to the Current Medication list given to you today. Your physician has requested that you have a carotid duplex. This test is an ultrasound of the carotid arteries in your neck. It looks at blood flow through these arteries that supply the brain with blood. Allow one hour for this exam. There are no restrictions or special instructions. DX MIGRAINE ?TIA MRI MRA BRAIN  DX  MIGRAINES ?TIA

## 2012-06-04 NOTE — Addendum Note (Signed)
Addended by: Scherrie Bateman E on: 06/04/2012 05:17 PM   Modules accepted: Orders

## 2012-06-06 ENCOUNTER — Ambulatory Visit (HOSPITAL_COMMUNITY): Admission: RE | Admit: 2012-06-06 | Payer: 59 | Source: Ambulatory Visit

## 2012-06-06 ENCOUNTER — Ambulatory Visit (HOSPITAL_COMMUNITY)
Admission: RE | Admit: 2012-06-06 | Discharge: 2012-06-06 | Disposition: A | Discharge status: Home / Self Care | Payer: 59 | Source: Ambulatory Visit | Attending: Cardiovascular Disease | Admitting: Cardiovascular Disease

## 2012-06-06 ENCOUNTER — Encounter (INDEPENDENT_AMBULATORY_CARE_PROVIDER_SITE_OTHER): Payer: 59

## 2012-06-06 DIAGNOSIS — G459 Transient cerebral ischemic attack, unspecified: Secondary | ICD-10-CM

## 2012-06-06 DIAGNOSIS — G43909 Migraine, unspecified, not intractable, without status migrainosus: Secondary | ICD-10-CM

## 2012-06-06 DIAGNOSIS — I6529 Occlusion and stenosis of unspecified carotid artery: Secondary | ICD-10-CM

## 2012-06-06 DIAGNOSIS — H53129 Transient visual loss, unspecified eye: Secondary | ICD-10-CM

## 2012-06-06 DIAGNOSIS — H547 Unspecified visual loss: Secondary | ICD-10-CM | POA: Insufficient documentation

## 2012-06-06 MED ORDER — GADOBENATE DIMEGLUMINE 529 MG/ML IV SOLN
20.0000 mL | Freq: Once | INTRAVENOUS | Status: AC | PRN
  Administered 2012-06-06: 20 mL via INTRAVENOUS

## 2012-06-08 ENCOUNTER — Telehealth: Payer: Self-pay | Admitting: Cardiovascular Disease

## 2012-06-08 NOTE — Telephone Encounter (Signed)
New msg Pt wants to know mri results

## 2012-06-08 NOTE — Telephone Encounter (Signed)
Given results MRI

## 2012-06-14 NOTE — Telephone Encounter (Signed)
F/U  Patient calling for test results .

## 2012-06-14 NOTE — Telephone Encounter (Signed)
LMTCB ./CY 

## 2012-06-14 NOTE — Telephone Encounter (Signed)
PT AWARE OF CAROTID RESULTS ./CY 

## 2012-07-20 ENCOUNTER — Other Ambulatory Visit: Payer: Self-pay | Admitting: Gastroenterology

## 2012-10-04 ENCOUNTER — Other Ambulatory Visit (HOSPITAL_COMMUNITY): Payer: Self-pay | Admitting: Family Medicine

## 2012-10-04 DIAGNOSIS — Z1231 Encounter for screening mammogram for malignant neoplasm of breast: Secondary | ICD-10-CM

## 2012-10-18 ENCOUNTER — Ambulatory Visit (HOSPITAL_COMMUNITY)
Admission: RE | Admit: 2012-10-18 | Discharge: 2012-10-18 | Disposition: A | Discharge status: Home / Self Care | Payer: 59 | Source: Ambulatory Visit | Attending: Family Medicine | Admitting: Family Medicine

## 2012-10-18 ENCOUNTER — Other Ambulatory Visit (HOSPITAL_COMMUNITY): Payer: Self-pay | Admitting: Family Medicine

## 2012-10-18 DIAGNOSIS — Z1231 Encounter for screening mammogram for malignant neoplasm of breast: Secondary | ICD-10-CM | POA: Insufficient documentation

## 2012-11-08 ENCOUNTER — Other Ambulatory Visit: Payer: Self-pay | Admitting: Dermatology

## 2013-06-12 ENCOUNTER — Encounter (INDEPENDENT_AMBULATORY_CARE_PROVIDER_SITE_OTHER): Payer: 59

## 2013-06-12 DIAGNOSIS — I6529 Occlusion and stenosis of unspecified carotid artery: Secondary | ICD-10-CM

## 2013-11-07 ENCOUNTER — Other Ambulatory Visit (HOSPITAL_COMMUNITY): Payer: Self-pay | Admitting: Family Medicine

## 2013-11-07 DIAGNOSIS — Z0000 Encounter for general adult medical examination without abnormal findings: Secondary | ICD-10-CM

## 2013-11-13 ENCOUNTER — Ambulatory Visit (HOSPITAL_COMMUNITY)
Admission: RE | Admit: 2013-11-13 | Discharge: 2013-11-13 | Disposition: A | Discharge status: Home / Self Care | Payer: 59 | Source: Ambulatory Visit | Attending: Family Medicine | Admitting: Family Medicine

## 2013-11-13 DIAGNOSIS — Z0000 Encounter for general adult medical examination without abnormal findings: Secondary | ICD-10-CM

## 2013-11-13 DIAGNOSIS — Z1231 Encounter for screening mammogram for malignant neoplasm of breast: Secondary | ICD-10-CM | POA: Insufficient documentation

## 2014-12-24 ENCOUNTER — Other Ambulatory Visit (HOSPITAL_COMMUNITY): Payer: Self-pay | Admitting: Family Medicine

## 2014-12-24 DIAGNOSIS — Z1231 Encounter for screening mammogram for malignant neoplasm of breast: Secondary | ICD-10-CM

## 2014-12-30 ENCOUNTER — Ambulatory Visit (HOSPITAL_COMMUNITY)
Admission: RE | Admit: 2014-12-30 | Discharge: 2014-12-30 | Disposition: A | Discharge status: Home / Self Care | Payer: 59 | Source: Ambulatory Visit | Attending: Family Medicine | Admitting: Family Medicine

## 2014-12-30 DIAGNOSIS — Z1231 Encounter for screening mammogram for malignant neoplasm of breast: Secondary | ICD-10-CM | POA: Diagnosis not present

## 2015-06-04 ENCOUNTER — Other Ambulatory Visit (HOSPITAL_COMMUNITY): Payer: Self-pay | Admitting: Sports Medicine

## 2015-06-04 DIAGNOSIS — M25561 Pain in right knee: Secondary | ICD-10-CM

## 2015-06-08 ENCOUNTER — Ambulatory Visit (HOSPITAL_COMMUNITY)
Admission: RE | Admit: 2015-06-08 | Discharge: 2015-06-08 | Disposition: A | Discharge status: Home / Self Care | Payer: 59 | Source: Ambulatory Visit | Attending: Sports Medicine | Admitting: Sports Medicine

## 2015-06-08 DIAGNOSIS — M25461 Effusion, right knee: Secondary | ICD-10-CM | POA: Insufficient documentation

## 2015-06-08 DIAGNOSIS — R200 Anesthesia of skin: Secondary | ICD-10-CM | POA: Diagnosis present

## 2015-06-08 DIAGNOSIS — M179 Osteoarthritis of knee, unspecified: Secondary | ICD-10-CM | POA: Insufficient documentation

## 2015-06-08 DIAGNOSIS — M25561 Pain in right knee: Secondary | ICD-10-CM

## 2015-06-12 ENCOUNTER — Ambulatory Visit (HOSPITAL_COMMUNITY): Payer: 59

## 2015-07-15 ENCOUNTER — Other Ambulatory Visit: Payer: Self-pay | Admitting: Cardiovascular Disease

## 2015-07-15 DIAGNOSIS — I6522 Occlusion and stenosis of left carotid artery: Secondary | ICD-10-CM

## 2015-07-20 ENCOUNTER — Ambulatory Visit (HOSPITAL_COMMUNITY)
Admission: RE | Admit: 2015-07-20 | Discharge: 2015-07-20 | Disposition: A | Discharge status: Home / Self Care | Payer: 59 | Source: Ambulatory Visit | Attending: Cardiology | Admitting: Cardiology

## 2015-07-20 DIAGNOSIS — I6523 Occlusion and stenosis of bilateral carotid arteries: Secondary | ICD-10-CM | POA: Insufficient documentation

## 2015-07-20 DIAGNOSIS — E785 Hyperlipidemia, unspecified: Secondary | ICD-10-CM | POA: Insufficient documentation

## 2015-07-20 DIAGNOSIS — I6522 Occlusion and stenosis of left carotid artery: Secondary | ICD-10-CM

## 2015-07-20 DIAGNOSIS — I10 Essential (primary) hypertension: Secondary | ICD-10-CM | POA: Insufficient documentation

## 2015-07-23 ENCOUNTER — Telehealth: Payer: Self-pay | Admitting: Cardiovascular Disease

## 2015-07-23 NOTE — Telephone Encounter (Signed)
F/u  Pt returning Christine's phone call., Please call back and discuss.

## 2015-07-23 NOTE — Telephone Encounter (Signed)
PT  AWARE OF  TEST  RESULTS  ./CY 

## 2015-10-22 MED FILL — PRAVASTATIN NA 20 MG TAB: 20 | 30 days supply | Qty: 30 | Fill #2

## 2015-10-22 MED FILL — DOXYCYCLINE HYCLATE 100 MG: 100 | 90 days supply | Qty: 90 | Fill #2

## 2015-10-23 MED FILL — buPROPion HCL 100 MG TABS: 100 | 30 days supply | Qty: 60 | Fill #2

## 2015-11-02 MED FILL — FLUoxetine HCL 10 MG CAPS: 10 | 90 days supply | Qty: 90 | Fill #0

## 2015-11-05 MED FILL — MOMETASONE FUROATE 50 MCG S: 50 | 30 days supply | Qty: 17 | Fill #4

## 2015-11-24 DIAGNOSIS — M7582 Other shoulder lesions, left shoulder: Secondary | ICD-10-CM | POA: Diagnosis not present

## 2015-11-24 MED FILL — MELOXICAM 15 MG TABLET: 15 | 30 days supply | Qty: 30 | Fill #0

## 2015-12-21 MED FILL — MELOXICAM 15 MG TABLET: 15 | 30 days supply | Qty: 30 | Fill #1

## 2015-12-25 ENCOUNTER — Other Ambulatory Visit: Payer: Self-pay

## 2015-12-25 DIAGNOSIS — Z1231 Encounter for screening mammogram for malignant neoplasm of breast: Secondary | ICD-10-CM

## 2016-01-12 ENCOUNTER — Ambulatory Visit
Admission: RE | Admit: 2016-01-12 | Discharge: 2016-01-12 | Disposition: A | Discharge status: Home / Self Care | Payer: 59 | Source: Ambulatory Visit

## 2016-01-12 DIAGNOSIS — Z1231 Encounter for screening mammogram for malignant neoplasm of breast: Secondary | ICD-10-CM | POA: Diagnosis not present

## 2016-01-27 DIAGNOSIS — I10 Essential (primary) hypertension: Secondary | ICD-10-CM | POA: Diagnosis not present

## 2016-01-27 DIAGNOSIS — R42 Dizziness and giddiness: Secondary | ICD-10-CM | POA: Diagnosis not present

## 2016-01-27 MED FILL — HYDROCHLOROTHIAZIDE 12.5 MG: 12.5 | 30 days supply | Qty: 30 | Fill #0

## 2016-01-27 MED FILL — MECLIZINE 25 MG TABLET: 25 | 10 days supply | Qty: 30 | Fill #0

## 2016-02-10 MED FILL — MELOXICAM 15 MG TABLET: 15 | 30 days supply | Qty: 30 | Fill #2

## 2016-02-10 MED FILL — FLUoxetine HCL 10 MG CAPS: 10 | 90 days supply | Qty: 90 | Fill #1

## 2016-02-24 DIAGNOSIS — R739 Hyperglycemia, unspecified: Secondary | ICD-10-CM | POA: Diagnosis not present

## 2016-02-24 DIAGNOSIS — Z0000 Encounter for general adult medical examination without abnormal findings: Secondary | ICD-10-CM | POA: Diagnosis not present

## 2016-02-24 DIAGNOSIS — Z6841 Body Mass Index (BMI) 40.0 and over, adult: Secondary | ICD-10-CM | POA: Diagnosis not present

## 2016-02-24 DIAGNOSIS — J4520 Mild intermittent asthma, uncomplicated: Secondary | ICD-10-CM | POA: Diagnosis not present

## 2016-02-24 DIAGNOSIS — I10 Essential (primary) hypertension: Secondary | ICD-10-CM | POA: Diagnosis not present

## 2016-02-24 DIAGNOSIS — F419 Anxiety disorder, unspecified: Secondary | ICD-10-CM | POA: Diagnosis not present

## 2016-02-24 DIAGNOSIS — E782 Mixed hyperlipidemia: Secondary | ICD-10-CM | POA: Diagnosis not present

## 2016-02-24 MED FILL — busPIRone HCL 10 MG TABS: 10 | 30 days supply | Qty: 60 | Fill #0

## 2016-03-15 DIAGNOSIS — H04122 Dry eye syndrome of left lacrimal gland: Secondary | ICD-10-CM | POA: Diagnosis not present

## 2016-03-16 MED FILL — HYDROCHLOROTHIAZIDE 12.5 MG: 12.5 | 30 days supply | Qty: 30 | Fill #0

## 2016-03-17 DIAGNOSIS — L821 Other seborrheic keratosis: Secondary | ICD-10-CM | POA: Diagnosis not present

## 2016-03-17 DIAGNOSIS — Z85828 Personal history of other malignant neoplasm of skin: Secondary | ICD-10-CM | POA: Diagnosis not present

## 2016-03-17 DIAGNOSIS — D2262 Melanocytic nevi of left upper limb, including shoulder: Secondary | ICD-10-CM | POA: Diagnosis not present

## 2016-03-17 DIAGNOSIS — I788 Other diseases of capillaries: Secondary | ICD-10-CM | POA: Diagnosis not present

## 2016-03-17 DIAGNOSIS — D224 Melanocytic nevi of scalp and neck: Secondary | ICD-10-CM | POA: Diagnosis not present

## 2016-03-17 DIAGNOSIS — L918 Other hypertrophic disorders of the skin: Secondary | ICD-10-CM | POA: Diagnosis not present

## 2016-03-17 DIAGNOSIS — D2261 Melanocytic nevi of right upper limb, including shoulder: Secondary | ICD-10-CM | POA: Diagnosis not present

## 2016-03-17 DIAGNOSIS — D2271 Melanocytic nevi of right lower limb, including hip: Secondary | ICD-10-CM | POA: Diagnosis not present

## 2016-03-17 DIAGNOSIS — D225 Melanocytic nevi of trunk: Secondary | ICD-10-CM | POA: Diagnosis not present

## 2016-03-17 MED FILL — FLUOCINONIDE 0.05% GEL: 0.05 | 20 days supply | Qty: 60 | Fill #0

## 2016-03-17 MED FILL — DOXYCYCLINE HYCLATE 100 MG: 100 | 90 days supply | Qty: 90 | Fill #0

## 2016-03-21 MED FILL — ALPRAZolam 0.25 MG TABS: 0.25 | 30 days supply | Qty: 30 | Fill #0

## 2016-03-21 MED FILL — MELOXICAM 15 MG TABLET: 15 | 30 days supply | Qty: 30 | Fill #0

## 2016-04-11 MED FILL — HYDROCHLOROTHIAZIDE 12.5 MG: 12.5 | 30 days supply | Qty: 30 | Fill #1

## 2016-04-12 DIAGNOSIS — H5203 Hypermetropia, bilateral: Secondary | ICD-10-CM | POA: Diagnosis not present

## 2016-04-12 DIAGNOSIS — H524 Presbyopia: Secondary | ICD-10-CM | POA: Diagnosis not present

## 2016-04-25 DIAGNOSIS — I10 Essential (primary) hypertension: Secondary | ICD-10-CM | POA: Diagnosis not present

## 2016-05-04 MED FILL — FLUoxetine HCL 10 MG CAPS: 10 | 90 days supply | Qty: 90 | Fill #2

## 2016-05-04 MED FILL — HYDROCHLOROTHIAZIDE 12.5 MG: 12.5 | 30 days supply | Qty: 30 | Fill #2

## 2016-05-04 MED FILL — MELOXICAM 15 MG TABLET: 15 | 30 days supply | Qty: 30 | Fill #1

## 2016-05-04 MED FILL — ALPRAZolam 0.25 MG TABS: 0.25 | 30 days supply | Qty: 30 | Fill #1

## 2016-06-01 DIAGNOSIS — H04203 Unspecified epiphora, bilateral lacrimal glands: Secondary | ICD-10-CM | POA: Diagnosis not present

## 2016-06-01 MED FILL — OLOPATADINE HCL 0.1% EYE DR: 0.1 | 50 days supply | Qty: 5 | Fill #0

## 2016-06-01 MED FILL — MELOXICAM 15 MG TABLET: 15 | 30 days supply | Qty: 30 | Fill #2

## 2016-06-01 MED FILL — ALPRAZolam 0.25 MG TABS: 0.25 | 30 days supply | Qty: 30 | Fill #2

## 2016-06-01 MED FILL — HYDROCHLOROTHIAZIDE 12.5 MG: 12.5 | 30 days supply | Qty: 30 | Fill #3

## 2016-06-06 DIAGNOSIS — H04551 Acquired stenosis of right nasolacrimal duct: Secondary | ICD-10-CM | POA: Diagnosis not present

## 2016-06-21 DIAGNOSIS — H04561 Stenosis of right lacrimal punctum: Secondary | ICD-10-CM | POA: Diagnosis not present

## 2016-06-21 DIAGNOSIS — H04562 Stenosis of left lacrimal punctum: Secondary | ICD-10-CM | POA: Diagnosis not present

## 2016-06-21 DIAGNOSIS — H04213 Epiphora due to excess lacrimation, bilateral lacrimal glands: Secondary | ICD-10-CM | POA: Diagnosis not present

## 2016-06-21 MED FILL — FLUOROMETHOLONE 0.1% DROPS: 0.1 | 30 days supply | Qty: 5 | Fill #0

## 2016-07-05 MED FILL — HYDROCHLOROTHIAZIDE 12.5 MG: 12.5 | 30 days supply | Qty: 30 | Fill #4

## 2016-07-05 MED FILL — MELOXICAM 15 MG TABLET: 15 | 30 days supply | Qty: 30 | Fill #3

## 2016-07-05 MED FILL — DOXYCYCLINE HYCLATE 100 MG: 100 | 90 days supply | Qty: 90 | Fill #1

## 2016-08-05 DIAGNOSIS — H04213 Epiphora due to excess lacrimation, bilateral lacrimal glands: Secondary | ICD-10-CM | POA: Diagnosis not present

## 2016-08-05 DIAGNOSIS — H11433 Conjunctival hyperemia, bilateral: Secondary | ICD-10-CM | POA: Diagnosis not present

## 2016-08-05 DIAGNOSIS — H04562 Stenosis of left lacrimal punctum: Secondary | ICD-10-CM | POA: Diagnosis not present

## 2016-08-05 DIAGNOSIS — H04561 Stenosis of right lacrimal punctum: Secondary | ICD-10-CM | POA: Diagnosis not present

## 2016-08-05 MED FILL — HYDROCHLOROTHIAZIDE 12.5 MG: 12.5 | 30 days supply | Qty: 30 | Fill #5

## 2016-08-05 MED FILL — MELOXICAM 15 MG TABLET: 15 | 30 days supply | Qty: 30 | Fill #4

## 2016-08-05 MED FILL — RESTASIS 0.05% EYE EMULSION: 0.05 | 90 days supply | Qty: 180 | Fill #0

## 2016-08-05 MED FILL — FLUoxetine HCL 10 MG CAPS: 10 | 90 days supply | Qty: 90 | Fill #3

## 2016-09-15 MED FILL — MELOXICAM 15 MG TABLET: 15 | 30 days supply | Qty: 30 | Fill #5

## 2016-09-15 MED FILL — HYDROCHLOROTHIAZIDE 12.5 MG: 12.5 | 30 days supply | Qty: 30 | Fill #6

## 2016-09-23 DIAGNOSIS — H1031 Unspecified acute conjunctivitis, right eye: Secondary | ICD-10-CM | POA: Diagnosis not present

## 2016-10-20 MED FILL — HYDROCHLOROTHIAZIDE 12.5 MG: 12.5 | 30 days supply | Qty: 30 | Fill #7

## 2016-10-20 MED FILL — DOXYCYCLINE HYCLATE 100 MG: 100 | 90 days supply | Qty: 90 | Fill #2

## 2016-11-15 DIAGNOSIS — H04209 Unspecified epiphora, unspecified lacrimal gland: Secondary | ICD-10-CM | POA: Diagnosis not present

## 2016-11-15 DIAGNOSIS — H04213 Epiphora due to excess lacrimation, bilateral lacrimal glands: Secondary | ICD-10-CM | POA: Diagnosis not present

## 2016-11-16 MED FILL — HYDROCHLOROTHIAZIDE 12.5 MG: 12.5 | 30 days supply | Qty: 30 | Fill #8

## 2016-11-16 MED FILL — FLUoxetine HCL 10 MG CAPS: 10 | 90 days supply | Qty: 90 | Fill #0

## 2016-12-28 MED FILL — HYDROCHLOROTHIAZIDE 12.5 MG: 12.5 | 30 days supply | Qty: 30 | Fill #9

## 2017-02-02 MED FILL — HYDROCHLOROTHIAZIDE 12.5 MG: 12.5 | 30 days supply | Qty: 30 | Fill #10

## 2017-02-06 DIAGNOSIS — H10501 Unspecified blepharoconjunctivitis, right eye: Secondary | ICD-10-CM | POA: Diagnosis not present

## 2017-02-06 MED FILL — TOBRAMYCIN-DEXAMETH OPTH SU: 0.3-0.1 | 5 days supply | Qty: 5 | Fill #0

## 2017-02-27 MED FILL — HYDROCHLOROTHIAZIDE 12.5 MG: 12.5 | 30 days supply | Qty: 30 | Fill #11

## 2017-03-07 DIAGNOSIS — R739 Hyperglycemia, unspecified: Secondary | ICD-10-CM | POA: Diagnosis not present

## 2017-03-07 DIAGNOSIS — E782 Mixed hyperlipidemia: Secondary | ICD-10-CM | POA: Diagnosis not present

## 2017-03-07 DIAGNOSIS — E559 Vitamin D deficiency, unspecified: Secondary | ICD-10-CM | POA: Diagnosis not present

## 2017-03-07 DIAGNOSIS — I10 Essential (primary) hypertension: Secondary | ICD-10-CM | POA: Diagnosis not present

## 2017-03-07 DIAGNOSIS — J301 Allergic rhinitis due to pollen: Secondary | ICD-10-CM | POA: Diagnosis not present

## 2017-03-07 DIAGNOSIS — Z0000 Encounter for general adult medical examination without abnormal findings: Secondary | ICD-10-CM | POA: Diagnosis not present

## 2017-03-07 DIAGNOSIS — E611 Iron deficiency: Secondary | ICD-10-CM | POA: Diagnosis not present

## 2017-03-07 MED FILL — MOMETASONE FUROATE 50 MCG S: 50 | 30 days supply | Qty: 17 | Fill #0

## 2017-03-07 MED FILL — ALPRAZolam 0.25 MG TABS: 0.25 | 30 days supply | Qty: 30 | Fill #0

## 2017-03-10 MED FILL — VIT D2 1.25 MG (50,000 UNIT: 1.25 MG | 56 days supply | Qty: 8 | Fill #0

## 2017-03-24 DIAGNOSIS — J329 Chronic sinusitis, unspecified: Secondary | ICD-10-CM | POA: Diagnosis not present

## 2017-03-29 MED FILL — HYDROCHLOROTHIAZIDE 12.5 MG: 12.5 | 90 days supply | Qty: 90 | Fill #0

## 2017-03-30 MED FILL — TOBRAMYCIN-DEXAMETH OPTH SU: 0.3-0.1 | 5 days supply | Qty: 5 | Fill #1

## 2017-03-31 DIAGNOSIS — L820 Inflamed seborrheic keratosis: Secondary | ICD-10-CM | POA: Diagnosis not present

## 2017-03-31 DIAGNOSIS — L821 Other seborrheic keratosis: Secondary | ICD-10-CM | POA: Diagnosis not present

## 2017-03-31 DIAGNOSIS — D2261 Melanocytic nevi of right upper limb, including shoulder: Secondary | ICD-10-CM | POA: Diagnosis not present

## 2017-03-31 DIAGNOSIS — D2239 Melanocytic nevi of other parts of face: Secondary | ICD-10-CM | POA: Diagnosis not present

## 2017-03-31 DIAGNOSIS — D2262 Melanocytic nevi of left upper limb, including shoulder: Secondary | ICD-10-CM | POA: Diagnosis not present

## 2017-03-31 DIAGNOSIS — D485 Neoplasm of uncertain behavior of skin: Secondary | ICD-10-CM | POA: Diagnosis not present

## 2017-03-31 DIAGNOSIS — Z85828 Personal history of other malignant neoplasm of skin: Secondary | ICD-10-CM | POA: Diagnosis not present

## 2017-03-31 DIAGNOSIS — D2272 Melanocytic nevi of left lower limb, including hip: Secondary | ICD-10-CM | POA: Diagnosis not present

## 2017-03-31 DIAGNOSIS — D225 Melanocytic nevi of trunk: Secondary | ICD-10-CM | POA: Diagnosis not present

## 2017-03-31 DIAGNOSIS — L918 Other hypertrophic disorders of the skin: Secondary | ICD-10-CM | POA: Diagnosis not present

## 2017-03-31 MED FILL — MOMETASONE FUROATE 50 MCG S: 50 | 30 days supply | Qty: 17 | Fill #1

## 2017-04-18 DIAGNOSIS — H524 Presbyopia: Secondary | ICD-10-CM | POA: Diagnosis not present

## 2017-04-18 DIAGNOSIS — H5203 Hypermetropia, bilateral: Secondary | ICD-10-CM | POA: Diagnosis not present

## 2017-04-24 MED FILL — ALPRAZolam 0.25 MG TABS: 0.25 | 30 days supply | Qty: 30 | Fill #1

## 2017-04-24 MED FILL — MOMETASONE FUROATE 50 MCG S: 50 | 30 days supply | Qty: 17 | Fill #2

## 2017-05-31 MED FILL — MOMETASONE FUROATE 50 MCG S: 50 | 30 days supply | Qty: 17 | Fill #3

## 2017-05-31 MED FILL — ALPRAZolam 0.25 MG TABS: 0.25 | 30 days supply | Qty: 30 | Fill #2

## 2017-07-09 MED FILL — ALPRAZolam 0.25 MG TABS: 0.25 | 30 days supply | Qty: 30 | Fill #3

## 2017-07-09 MED FILL — HYDROCHLOROTHIAZIDE 12.5 MG: 12.5 | 90 days supply | Qty: 90 | Fill #1

## 2017-07-10 MED FILL — MOMETASONE FUROATE 50 MCG S: 50 | 30 days supply | Qty: 17 | Fill #4

## 2017-08-10 MED FILL — ALPRAZolam 0.25 MG TABS: 0.25 | 30 days supply | Qty: 30 | Fill #4

## 2017-08-10 MED FILL — MOMETASONE FUROATE 50 MCG S: 50 | 30 days supply | Qty: 17 | Fill #5

## 2017-08-22 MED FILL — AMOXICILLIN 875 MG TABLET: 875 | 7 days supply | Qty: 14 | Fill #0

## 2017-09-15 MED FILL — GAVILYTE-N SOLUTION: 420 | 2 days supply | Qty: 4000 | Fill #0

## 2017-09-20 DIAGNOSIS — Z86010 Personal history of colonic polyps: Secondary | ICD-10-CM | POA: Diagnosis not present

## 2017-09-20 DIAGNOSIS — K635 Polyp of colon: Secondary | ICD-10-CM | POA: Diagnosis not present

## 2017-09-20 DIAGNOSIS — D126 Benign neoplasm of colon, unspecified: Secondary | ICD-10-CM | POA: Diagnosis not present

## 2017-09-20 DIAGNOSIS — K5730 Diverticulosis of large intestine without perforation or abscess without bleeding: Secondary | ICD-10-CM | POA: Diagnosis not present

## 2017-09-21 MED FILL — MOMETASONE FUROATE 50 MCG S: 50 | 30 days supply | Qty: 17 | Fill #6

## 2017-09-21 MED FILL — AMOXICILLIN 875 MG TABLET: 875 | 7 days supply | Qty: 14 | Fill #1

## 2017-10-03 MED FILL — ESCITALOPRAM 10 MG TABLET: 10 | 90 days supply | Qty: 90 | Fill #0

## 2017-10-04 MED FILL — HYDROCHLOROTHIAZIDE 12.5 MG: 12.5 | 90 days supply | Qty: 90 | Fill #2

## 2017-10-26 ENCOUNTER — Other Ambulatory Visit: Payer: Self-pay | Admitting: Family Medicine

## 2017-10-26 DIAGNOSIS — Z1231 Encounter for screening mammogram for malignant neoplasm of breast: Secondary | ICD-10-CM

## 2017-11-16 ENCOUNTER — Ambulatory Visit
Admission: RE | Admit: 2017-11-16 | Discharge: 2017-11-16 | Disposition: A | Discharge status: Home / Self Care | Payer: 59 | Source: Ambulatory Visit | Attending: Family Medicine | Admitting: Family Medicine

## 2017-11-16 DIAGNOSIS — Z1231 Encounter for screening mammogram for malignant neoplasm of breast: Secondary | ICD-10-CM

## 2017-12-05 MED FILL — CLINDAMYCIN HCL 150 MG CAPS: 150 | 9 days supply | Qty: 30 | Fill #0

## 2017-12-29 MED FILL — CLINDAMYCIN HCL 150 MG CAPS: 150 | 9 days supply | Qty: 30 | Fill #1

## 2017-12-29 MED FILL — HYDROCHLOROTHIAZIDE 12.5 MG: 12.5 | 90 days supply | Qty: 90 | Fill #3

## 2018-02-01 MED FILL — SERTRALINE HCL 50 MG TABLET: 50 | 30 days supply | Qty: 30 | Fill #0

## 2018-03-08 MED FILL — SERTRALINE HCL 50 MG TABS: 50 | 30 days supply | Qty: 30 | Fill #1

## 2018-03-21 DIAGNOSIS — J4520 Mild intermittent asthma, uncomplicated: Secondary | ICD-10-CM | POA: Diagnosis not present

## 2018-03-21 DIAGNOSIS — Z6837 Body mass index (BMI) 37.0-37.9, adult: Secondary | ICD-10-CM | POA: Diagnosis not present

## 2018-03-21 DIAGNOSIS — R7303 Prediabetes: Secondary | ICD-10-CM | POA: Diagnosis not present

## 2018-03-21 DIAGNOSIS — I10 Essential (primary) hypertension: Secondary | ICD-10-CM | POA: Diagnosis not present

## 2018-03-21 DIAGNOSIS — E782 Mixed hyperlipidemia: Secondary | ICD-10-CM | POA: Diagnosis not present

## 2018-03-21 DIAGNOSIS — E559 Vitamin D deficiency, unspecified: Secondary | ICD-10-CM | POA: Diagnosis not present

## 2018-03-21 DIAGNOSIS — Z0000 Encounter for general adult medical examination without abnormal findings: Secondary | ICD-10-CM | POA: Diagnosis not present

## 2018-03-23 ENCOUNTER — Other Ambulatory Visit: Payer: Self-pay | Admitting: Family Medicine

## 2018-03-23 DIAGNOSIS — R221 Localized swelling, mass and lump, neck: Secondary | ICD-10-CM

## 2018-03-26 ENCOUNTER — Ambulatory Visit
Admission: RE | Admit: 2018-03-26 | Discharge: 2018-03-26 | Disposition: A | Discharge status: Home / Self Care | Payer: 59 | Source: Ambulatory Visit | Attending: Family Medicine | Admitting: Family Medicine

## 2018-03-26 DIAGNOSIS — R221 Localized swelling, mass and lump, neck: Secondary | ICD-10-CM

## 2018-04-06 MED FILL — SERTRALINE HCL 50 MG TABS: 50 | 30 days supply | Qty: 30 | Fill #2

## 2018-04-27 DIAGNOSIS — H5203 Hypermetropia, bilateral: Secondary | ICD-10-CM | POA: Diagnosis not present

## 2018-04-27 DIAGNOSIS — H524 Presbyopia: Secondary | ICD-10-CM | POA: Diagnosis not present

## 2018-05-04 DIAGNOSIS — D2262 Melanocytic nevi of left upper limb, including shoulder: Secondary | ICD-10-CM | POA: Diagnosis not present

## 2018-05-04 DIAGNOSIS — D225 Melanocytic nevi of trunk: Secondary | ICD-10-CM | POA: Diagnosis not present

## 2018-05-04 DIAGNOSIS — D2271 Melanocytic nevi of right lower limb, including hip: Secondary | ICD-10-CM | POA: Diagnosis not present

## 2018-05-04 DIAGNOSIS — L821 Other seborrheic keratosis: Secondary | ICD-10-CM | POA: Diagnosis not present

## 2018-05-04 DIAGNOSIS — Z85828 Personal history of other malignant neoplasm of skin: Secondary | ICD-10-CM | POA: Diagnosis not present

## 2018-05-04 DIAGNOSIS — D1801 Hemangioma of skin and subcutaneous tissue: Secondary | ICD-10-CM | POA: Diagnosis not present

## 2018-05-04 DIAGNOSIS — B078 Other viral warts: Secondary | ICD-10-CM | POA: Diagnosis not present

## 2018-05-04 MED FILL — DOXYCYCLINE HYCLATE 100 MG: 100 | 48 days supply | Qty: 90 | Fill #0

## 2018-05-22 MED FILL — FLUOCINONIDE 0.05 % GEL: 0.05 | 15 days supply | Qty: 15 | Fill #0

## 2018-07-09 MED FILL — HYDROCHLOROTHIAZIDE 12.5 MG: 12.5 | 30 days supply | Qty: 30 | Fill #0

## 2018-07-11 MED FILL — SERTRALINE HCL 50 MG TABLET: 50 | 30 days supply | Qty: 30 | Fill #3

## 2018-07-11 MED FILL — DOXYCYCLINE HYCLATE 100 MG: 100 | 48 days supply | Qty: 90 | Fill #1

## 2018-08-17 MED FILL — HYDROCHLOROTHIAZIDE 12.5 MG: 12.5 | 30 days supply | Qty: 30 | Fill #1

## 2018-09-27 MED FILL — SERTRALINE HCL 50 MG TABLET: 50 | 30 days supply | Qty: 30 | Fill #4

## 2018-09-27 MED FILL — HYDROCHLOROTHIAZIDE 12.5 MG: 12.5 | 30 days supply | Qty: 30 | Fill #2

## 2018-10-23 DIAGNOSIS — L121 Cicatricial pemphigoid: Secondary | ICD-10-CM | POA: Diagnosis not present

## 2018-10-24 MED FILL — METHYLPREDNISOLONE 4 MG TAB: 4 | 6 days supply | Qty: 21 | Fill #0

## 2018-10-24 MED FILL — TETRACYCLINE 500 MG CAPSULE: 500 | 30 days supply | Qty: 120 | Fill #0

## 2018-10-24 MED FILL — CLOBETASOL 0.05% GEL: 0.05 | 10 days supply | Qty: 30 | Fill #0

## 2018-10-31 DIAGNOSIS — H539 Unspecified visual disturbance: Secondary | ICD-10-CM | POA: Diagnosis not present

## 2018-11-02 MED FILL — HYDROCHLOROTHIAZIDE 12.5 MG: 12.5 | 30 days supply | Qty: 30 | Fill #3

## 2018-12-07 ENCOUNTER — Emergency Department (HOSPITAL_BASED_OUTPATIENT_CLINIC_OR_DEPARTMENT_OTHER)
Admission: EM | Admit: 2018-12-07 | Discharge: 2018-12-07 | Disposition: A | Discharge status: Home / Self Care | Payer: 59 | Attending: Family Medicine | Admitting: Family Medicine

## 2018-12-07 ENCOUNTER — Other Ambulatory Visit: Payer: Self-pay

## 2018-12-07 ENCOUNTER — Encounter (HOSPITAL_BASED_OUTPATIENT_CLINIC_OR_DEPARTMENT_OTHER): Payer: Self-pay | Admitting: *Deleted

## 2018-12-07 ENCOUNTER — Emergency Department (HOSPITAL_BASED_OUTPATIENT_CLINIC_OR_DEPARTMENT_OTHER): Payer: 59

## 2018-12-07 DIAGNOSIS — I82621 Acute embolism and thrombosis of deep veins of right upper extremity: Secondary | ICD-10-CM | POA: Diagnosis not present

## 2018-12-07 DIAGNOSIS — Z87891 Personal history of nicotine dependence: Secondary | ICD-10-CM | POA: Insufficient documentation

## 2018-12-07 DIAGNOSIS — M79602 Pain in left arm: Secondary | ICD-10-CM | POA: Diagnosis not present

## 2018-12-07 DIAGNOSIS — I10 Essential (primary) hypertension: Secondary | ICD-10-CM | POA: Diagnosis not present

## 2018-12-07 DIAGNOSIS — J45909 Unspecified asthma, uncomplicated: Secondary | ICD-10-CM | POA: Insufficient documentation

## 2018-12-07 DIAGNOSIS — Z8673 Personal history of transient ischemic attack (TIA), and cerebral infarction without residual deficits: Secondary | ICD-10-CM | POA: Insufficient documentation

## 2018-12-07 DIAGNOSIS — I808 Phlebitis and thrombophlebitis of other sites: Secondary | ICD-10-CM | POA: Diagnosis not present

## 2018-12-07 DIAGNOSIS — H538 Other visual disturbances: Secondary | ICD-10-CM | POA: Insufficient documentation

## 2018-12-07 DIAGNOSIS — R079 Chest pain, unspecified: Secondary | ICD-10-CM | POA: Insufficient documentation

## 2018-12-07 DIAGNOSIS — R05 Cough: Secondary | ICD-10-CM | POA: Diagnosis not present

## 2018-12-07 LAB — CBC WITH DIFFERENTIAL/PLATELET
Abs Immature Granulocytes: 0.02 10*3/uL (ref 0.00–0.07)
Basophils Absolute: 0.1 10*3/uL (ref 0.0–0.1)
Basophils Relative: 1 %
Eosinophils Absolute: 0.6 10*3/uL — ABNORMAL HIGH (ref 0.0–0.5)
Eosinophils Relative: 9 %
HCT: 41.9 % (ref 36.0–46.0)
Hemoglobin: 13.9 g/dL (ref 12.0–15.0)
Immature Granulocytes: 0 %
Lymphocytes Relative: 30 %
Lymphs Abs: 2 10*3/uL (ref 0.7–4.0)
MCH: 32.1 pg (ref 26.0–34.0)
MCHC: 33.2 g/dL (ref 30.0–36.0)
MCV: 96.8 fL (ref 80.0–100.0)
Monocytes Absolute: 0.5 10*3/uL (ref 0.1–1.0)
Monocytes Relative: 7 %
Neutro Abs: 3.6 10*3/uL (ref 1.7–7.7)
Neutrophils Relative %: 53 %
Platelets: 356 10*3/uL (ref 150–400)
RBC: 4.33 MIL/uL (ref 3.87–5.11)
RDW: 12.4 % (ref 11.5–15.5)
WBC: 6.7 10*3/uL (ref 4.0–10.5)
nRBC: 0 % (ref 0.0–0.2)

## 2018-12-07 LAB — BASIC METABOLIC PANEL
Anion gap: 10 (ref 5–15)
BUN: 14 mg/dL (ref 6–20)
CO2: 26 mmol/L (ref 22–32)
Calcium: 9.2 mg/dL (ref 8.9–10.3)
Chloride: 101 mmol/L (ref 98–111)
Creatinine, Ser: 0.78 mg/dL (ref 0.44–1.00)
GFR calc Af Amer: >60 mL/min (ref >60)
GFR calc non Af Amer: >60 mL/min (ref >60)
Glucose, Bld: 92 mg/dL (ref 70–99)
Potassium: 3.4 mmol/L — ABNORMAL LOW (ref 3.5–5.1)
Sodium: 137 mmol/L (ref 135–145)

## 2018-12-07 LAB — TROPONIN I: Troponin I: <0.03 ng/mL (ref <0.03)

## 2018-12-07 NOTE — Discharge Instructions (Signed)
Take motrin 600 mg every 6 hrs for pain and will help with the superficial clot. Apply warm compresses.   You need repeat ultrasound in a month to make sure the clot resolves   See your doctor this week   Return to ER if you have worse arm pain or swelling, chest pain, trouble breathing

## 2018-12-07 NOTE — ED Notes (Signed)
ED Provider at bedside. 

## 2018-12-07 NOTE — ED Provider Notes (Signed)
  Physical Exam  BP 138/72 (BP Location: Left Arm)   Pulse 87   Temp 98 F (36.7 C) (Oral)   Resp 18   Ht 5\' 5"  (1.651 m)   Wt 103.4 kg   SpO2 98%   BMI 37.94 kg/m   Physical Exam  ED Course/Procedures     Procedures  MDM  Patient care assumed from Dr. Laverta Baltimore at 4 pm. Patient has been having chest pains and cough. Subjective SOB and L arm swelling s/p IV placement. Not tachycardic. Low suspicion for PE. Sign out pending CXR, DVT study LUE.   7:08 PM LUE DVT study showed L cephalic thrombophlebitis, no actual DVT. CXR clear. Pain for several days and no hx of CAD so 1 set troponin sufficient per Dr. Laverta Baltimore. Told her to take motrin, warm compresses, repeat US in a month to ensure resolution        Drenda Freeze, MD 12/07/18 [phone]

## 2018-12-07 NOTE — ED Triage Notes (Signed)
Chest pain for a month and sob for a long time per pt. She called her MD this am about a different problem and mentioned to the nurse about the pain and SOB. She was told to come to the ED for chest pain evaluation.

## 2018-12-07 NOTE — ED Provider Notes (Signed)
Emergency Department Provider Note   I have reviewed the triage vital signs and the nursing notes.   HISTORY  Chief Complaint Chest Pain   HPI Kristy Colon is a 63 y.o. female with PMH of asthma, HLD, and HTN presents to the emergency department with left arm discomfort, chest pain, and intermittent vision change on the right.  Patient states that symptoms began 2 months ago after taking some steroid reportedly for dental pain.  Intermittent vision change in the right eye which last occurred 1 week ago.  She describes temporary visual field loss in the lower visual field that spontaneously resolves.  She her ophthalmologist who confirmed her eye exam is normal is not having follow-up plan moving forward.  She required an IV placement in the left arm and since that time has had pain, mild swelling.  She states that she was initially concerned about this and called her PCP to make an appointment but in speaking with the nurse that she describes some mild elevated heart rate along with chest tightness today and was referred to the emergency department.  She has not had the vision change for the past week.  No numbness or tingling.  She has follow-up as an outpatient for a prior TIA and has been compliant with her medications.  Patient is not having any active chest pain.  No history of DVT or PE.  Past Medical History:  Diagnosis Date  . Asthma   . Hyperlipemia   . Hypertension     Patient Active Problem List   Diagnosis Date Noted  . TIA (transient ischemic attack) 06/04/2012  . Chest pain 06/04/2012  . Asthma   . Hypertension   . Hyperlipemia     Past Surgical History:  Procedure Laterality Date  . ABDOMINAL HYSTERECTOMY    . CHOLECYSTECTOMY      Allergies Patient has no known allergies.  Family History  Problem Relation Age of Onset  . Diabetes Other   . Cancer Other     Social History Social History   Tobacco Use  . Smoking status: Former Research scientist (life sciences)  .  Smokeless tobacco: Never Used  Substance Use Topics  . Alcohol use: No  . Drug use: Never    Review of Systems  Constitutional: No fever/chills Eyes: Occasional vision change (not in the last 7-10 days).  ENT: No sore throat. Cardiovascular: Positive intermittent chest pain. Respiratory: Denies shortness of breath. Gastrointestinal: No abdominal pain.  No nausea, no vomiting.  No diarrhea.  No constipation. Genitourinary: Negative for dysuria. Musculoskeletal: Negative for back pain. Positive left arm pain.  Skin: Negative for rash. Neurological: Negative for headaches, focal weakness or numbness.  10-point ROS otherwise negative.  ____________________________________________   PHYSICAL EXAM:  VITAL SIGNS: ED Triage Vitals  Enc Vitals Group     BP 12/07/18 1226 (!) 161/82     Pulse Rate 12/07/18 1226 92     Resp 12/07/18 1226 18     Temp 12/07/18 1226 98 F (36.7 C)     Temp Source 12/07/18 1226 Oral     SpO2 12/07/18 1226 100 %     Weight 12/07/18 1226 228 lb (103.4 kg)     Height 12/07/18 1226 5\' 5"  (1.651 m)   Constitutional: Alert and oriented. Well appearing and in no acute distress. Eyes: Conjunctivae are normal. PERRL. EOMI. Head: Atraumatic. Nose: No congestion/rhinnorhea. Mouth/Throat: Mucous membranes are moist.  Neck: No stridor.  Cardiovascular: Normal rate, regular rhythm. Good peripheral circulation. Grossly normal  heart sounds.   Respiratory: Normal respiratory effort.  No retractions. Lungs CTAB. Gastrointestinal: Soft and nontender. No distention.  Musculoskeletal: No lower extremity tenderness nor edema. No gross deformities of extremities. Neurologic:  Normal speech and language. No gross focal neurologic deficits are appreciated.  Skin:  Skin is warm, dry and intact. No rash noted. Palpable venous cord extending from the left [facility] to the lower bicep. No erythema or tenderness.   ____________________________________________   LABS (all labs  ordered are listed, but only abnormal results are displayed)  Labs Reviewed  BASIC METABOLIC PANEL - Abnormal; Notable for the following components:      Result Value   Potassium 3.4 (*)    All other components within normal limits  CBC WITH DIFFERENTIAL/PLATELET - Abnormal; Notable for the following components:   Eosinophils Absolute 0.6 (*)    All other components within normal limits  TROPONIN I   ____________________________________________  EKG   EKG Interpretation  Date/Time:  Friday December 07 2018 12:33:37 EST Ventricular Rate:  98 PR Interval:  152 QRS Duration: 86 QT Interval:  372 QTC Calculation: 474 R Axis:   149 Text Interpretation:  Normal sinus rhythm Possible Right ventricular hypertrophy Abnormal ECG No STEMI.  Confirmed by Nanda Quinton [phone]) on 12/07/2018 1:41:38 PM       ____________________________________________  RADIOLOGY  Dg Chest 2 View  Result Date: 12/07/2018 CLINICAL DATA:  Left arm pain for 3 weeks. Chronic cough and reflux. EXAM: CHEST - 2 VIEW COMPARISON:  05/18/2012 FINDINGS: The lungs are clear without focal pneumonia, edema, pneumothorax or pleural effusion. The cardiopericardial silhouette is within normal limits for size. The visualized bony structures of the thorax are intact. Telemetry leads overlie the chest. IMPRESSION: No active cardiopulmonary disease. Electronically Signed   By: Misty Stanley M.D.   On: 12/07/2018 18:53   US Venous Img Upper Left (dvt Study)  Addendum Date: 12/07/2018   ADDENDUM REPORT: 12/07/2018 18:23 ADDENDUM: Impression of report should be corrected to state: Acute thrombosis of the cephalic vein at the distal aspect of the upper arm. This represents superficial thrombophlebitis (NOT deep venous thrombosis). No evidence of deep venous thrombosis in the LEFT upper extremity. 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 Electronically Signed    By: Lavonia Dana M.D.   On: 12/07/2018 18:23   Result Date: 12/07/2018 CLINICAL DATA:  LEFT upper arm pain EXAM: LEFT UPPER EXTREMITY VENOUS DOPPLER ULTRASOUND TECHNIQUE: Gray-scale sonography with graded compression, as well as color Doppler and duplex ultrasound were performed to evaluate the upper extremity deep venous system from the level of the subclavian vein and including the jugular, axillary, basilic, radial, ulnar and upper cephalic vein. Spectral Doppler was utilized to evaluate flow at rest and with distal augmentation maneuvers. COMPARISON:  None FINDINGS: Contralateral Subclavian Vein: Respiratory phasicity is normal and symmetric with the symptomatic side. No evidence of thrombus. Normal compressibility. Internal Jugular Vein: No evidence of thrombus. Normal compressibility, respiratory phasicity and response to augmentation. Subclavian Vein: No evidence of thrombus. Normal compressibility, respiratory phasicity and response to augmentation. Axillary Vein: No evidence of thrombus. Normal compressibility, respiratory phasicity and response to augmentation. Cephalic Vein: Distended by hypoechoic thrombus distally in the upper arm. Absent spontaneous venous flow. Proximal cephalic vein in the upper arm is patent and compressible. Basilic Vein: No evidence of thrombus. Normal compressibility, respiratory phasicity and response to augmentation. Brachial Veins: No evidence of thrombus. Normal compressibility, respiratory phasicity and  response to augmentation. Radial Veins: No evidence of thrombus. Normal compressibility, respiratory phasicity and response to augmentation. Ulnar Veins: No evidence of thrombus. Normal compressibility, respiratory phasicity and response to augmentation. Venous Reflux:  None visualized. 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 IMPRESSION: Acute deep venous thrombosis involving the cephalic vein at the distal aspect of the upper arm. Remaining deep venous  system of the LEFT upper extremity is patent. 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 Electronically Signed: By: Lavonia Dana M.D. On: 12/07/2018 17:59    ____________________________________________   PROCEDURES  Procedure(s) performed:   Procedures  None ____________________________________________   INITIAL IMPRESSION / ASSESSMENT AND PLAN / ED COURSE  Pertinent labs & imaging results that were available during my care of the patient were reviewed by me and considered in my medical decision making (see chart for details).  Patient presents to the emergency department with left arm discomfort with some swelling.  There is a palpable, superficial thrombosis which tracks proximately 6 cm.  My suspicion for upper extremity DVT is low and this appears to be from IV placement.  Will obtain her upper extremity ultrasound to rule out deep venous involvement.   Patient also describes some associated chest discomfort which is why she was referred here by her PCP.  CXR with no acute findings.  Low suspicion for PE.  Patient's visual changes have not occurred in the past 7 to 10 days.  She will likely require outpatient neurology follow-up but doubt they are related to her primary issue which is her arm discomfort.   CXR and [place] pending. Care transferred to Dr. Darl Householder. Anticipate discharge if imaging is negative.  ____________________________________________  FINAL CLINICAL IMPRESSION(S) / ED DIAGNOSES  Final diagnoses:  Chest pain, unspecified type  Superficial thrombophlebitis of left upper extremity    Note:  This document was prepared using Dragon voice recognition software and may include unintentional dictation errors.  Nanda Quinton, MD Emergency Medicine    Karne Ozga, [facility], MD 12/07/18 [phone]

## 2018-12-12 MED FILL — HYDROCHLOROTHIAZIDE 12.5 MG: 12.5 | 30 days supply | Qty: 30 | Fill #4

## 2019-01-08 MED FILL — HYDROCHLOROTHIAZIDE 12.5 MG: 12.5 | 30 days supply | Qty: 30 | Fill #5

## 2019-02-25 MED FILL — HYDROCHLOROTHIAZIDE 12.5 MG: 12.5 | 30 days supply | Qty: 30 | Fill #6

## 2019-02-25 MED FILL — DOXYCYCLINE HYCLATE 100 MG: 100 | 90 days supply | Qty: 90 | Fill #2

## 2019-04-08 DIAGNOSIS — E782 Mixed hyperlipidemia: Secondary | ICD-10-CM | POA: Diagnosis not present

## 2019-04-08 DIAGNOSIS — Z0000 Encounter for general adult medical examination without abnormal findings: Secondary | ICD-10-CM | POA: Diagnosis not present

## 2019-04-08 DIAGNOSIS — J45909 Unspecified asthma, uncomplicated: Secondary | ICD-10-CM | POA: Diagnosis not present

## 2019-04-08 DIAGNOSIS — I10 Essential (primary) hypertension: Secondary | ICD-10-CM | POA: Diagnosis not present

## 2019-04-08 DIAGNOSIS — E559 Vitamin D deficiency, unspecified: Secondary | ICD-10-CM | POA: Diagnosis not present

## 2019-04-08 DIAGNOSIS — R7303 Prediabetes: Secondary | ICD-10-CM | POA: Diagnosis not present

## 2019-04-08 MED FILL — ALBUTEROL SULFATE HFA 108 (: 108 (90 BAS | 17 days supply | Qty: 9 | Fill #0

## 2019-04-08 MED FILL — VENLAFAXINE HCL ER 37.5 MG: 37.5 | 90 days supply | Qty: 90 | Fill #0

## 2019-04-08 MED FILL — ALPRAZolam 0.25 MG TABS: 0.25 | 30 days supply | Qty: 30 | Fill #0

## 2019-04-08 MED FILL — HYDROCHLOROTHIAZIDE 12.5 MG: 12.5 | 30 days supply | Qty: 30 | Fill #7

## 2019-05-08 DIAGNOSIS — H2513 Age-related nuclear cataract, bilateral: Secondary | ICD-10-CM | POA: Diagnosis not present

## 2019-05-08 DIAGNOSIS — H5203 Hypermetropia, bilateral: Secondary | ICD-10-CM | POA: Diagnosis not present

## 2019-05-08 DIAGNOSIS — H524 Presbyopia: Secondary | ICD-10-CM | POA: Diagnosis not present

## 2019-05-09 MED FILL — ALPRAZolam 0.25 MG TABS: 0.25 | 30 days supply | Qty: 30 | Fill #1

## 2019-05-09 MED FILL — HYDROCHLOROTHIAZIDE 12.5 MG: 12.5 | 30 days supply | Qty: 30 | Fill #8

## 2019-06-04 DIAGNOSIS — L821 Other seborrheic keratosis: Secondary | ICD-10-CM | POA: Diagnosis not present

## 2019-06-04 DIAGNOSIS — D2261 Melanocytic nevi of right upper limb, including shoulder: Secondary | ICD-10-CM | POA: Diagnosis not present

## 2019-06-04 DIAGNOSIS — L814 Other melanin hyperpigmentation: Secondary | ICD-10-CM | POA: Diagnosis not present

## 2019-06-04 DIAGNOSIS — D225 Melanocytic nevi of trunk: Secondary | ICD-10-CM | POA: Diagnosis not present

## 2019-06-04 DIAGNOSIS — D2262 Melanocytic nevi of left upper limb, including shoulder: Secondary | ICD-10-CM | POA: Diagnosis not present

## 2019-06-04 DIAGNOSIS — D2271 Melanocytic nevi of right lower limb, including hip: Secondary | ICD-10-CM | POA: Diagnosis not present

## 2019-06-04 DIAGNOSIS — Z85828 Personal history of other malignant neoplasm of skin: Secondary | ICD-10-CM | POA: Diagnosis not present

## 2019-06-04 DIAGNOSIS — L918 Other hypertrophic disorders of the skin: Secondary | ICD-10-CM | POA: Diagnosis not present

## 2019-06-04 DIAGNOSIS — D2272 Melanocytic nevi of left lower limb, including hip: Secondary | ICD-10-CM | POA: Diagnosis not present

## 2019-06-10 MED FILL — ALBUTEROL SULFATE HFA 108 (: 108 (90 BAS | 17 days supply | Qty: 9 | Fill #1

## 2019-06-10 MED FILL — HYDROCHLOROTHIAZIDE 12.5 MG: 12.5 | 30 days supply | Qty: 30 | Fill #9

## 2019-06-10 MED FILL — FLUOCINONIDE 0.05 % GEL: 0.05 | 10 days supply | Qty: 15 | Fill #0

## 2019-06-10 MED FILL — ALPRAZolam 0.25 MG TABS: 0.25 | 30 days supply | Qty: 30 | Fill #2

## 2019-07-15 MED FILL — ALPRAZolam 0.25 MG TABS: 0.25 | 30 days supply | Qty: 30 | Fill #3

## 2019-07-15 MED FILL — HYDROCHLOROTHIAZIDE 12.5 MG: 12.5 | 90 days supply | Qty: 90 | Fill #0

## 2019-07-15 MED FILL — VENLAFAXINE HCL ER 37.5 MG: 37.5 | 90 days supply | Qty: 90 | Fill #1

## 2019-08-23 MED FILL — ALPRAZolam 0.25 MG TABS: 0.25 | 30 days supply | Qty: 30 | Fill #4

## 2019-09-29 MED FILL — ALPRAZolam 0.25 MG TABS: 0.25 | 30 days supply | Qty: 30 | Fill #5

## 2019-09-30 MED FILL — ALBUTEROL SULFATE HFA 108 (: 108 (90 BAS | 17 days supply | Qty: 9 | Fill #2

## 2019-09-30 MED FILL — FLUOCINONIDE 0.05 % GEL: 0.05 | 10 days supply | Qty: 15 | Fill #1

## 2019-09-30 MED FILL — HYDROCHLOROTHIAZIDE 12.5 MG: 12.5 | 90 days supply | Qty: 90 | Fill #0

## 2019-11-26 MED FILL — FLUoxetine HCL 20 MG CAPS: 20 | 30 days supply | Qty: 30 | Fill #0

## 2019-12-20 MED FILL — HYDROCODON-APAP 5-325: 5-325 | 3 days supply | Qty: 12 | Fill #0

## 2019-12-20 MED FILL — IBUPROFEN 800 MG TABS: 800 | 5 days supply | Qty: 20 | Fill #0

## 2019-12-24 MED FILL — HYDROCHLOROTHIAZIDE 12.5 MG: 12.5 | 90 days supply | Qty: 90 | Fill #1

## 2019-12-24 MED FILL — FLUoxetine HCL 20 MG CAPS: 20 | 30 days supply | Qty: 30 | Fill #1

## 2020-01-24 MED FILL — FLUoxetine HCL 20 MG CAPS: 20 | 30 days supply | Qty: 30 | Fill #2

## 2020-03-17 MED FILL — HYDROCHLOROTHIAZIDE 12.5 MG: 12.5 | 90 days supply | Qty: 90 | Fill #2

## 2020-03-17 MED FILL — FLUOCINONIDE 0.05% GEL: 0.05 | 10 days supply | Qty: 15 | Fill #2

## 2020-03-17 MED FILL — ALBUTEROL SULFATE HFA 108 (: 108 (90 BAS | 17 days supply | Qty: 9 | Fill #3

## 2020-03-17 MED FILL — FLUoxetine HCL 20 MG CAPS: 20 | 30 days supply | Qty: 30 | Fill #3

## 2020-04-07 DIAGNOSIS — Z5181 Encounter for therapeutic drug level monitoring: Secondary | ICD-10-CM | POA: Diagnosis not present

## 2020-04-07 DIAGNOSIS — E538 Deficiency of other specified B group vitamins: Secondary | ICD-10-CM | POA: Diagnosis not present

## 2020-04-07 DIAGNOSIS — E559 Vitamin D deficiency, unspecified: Secondary | ICD-10-CM | POA: Diagnosis not present

## 2020-04-07 DIAGNOSIS — Z13220 Encounter for screening for lipoid disorders: Secondary | ICD-10-CM | POA: Diagnosis not present

## 2020-04-07 DIAGNOSIS — Z0000 Encounter for general adult medical examination without abnormal findings: Secondary | ICD-10-CM | POA: Diagnosis not present

## 2020-04-07 DIAGNOSIS — J4520 Mild intermittent asthma, uncomplicated: Secondary | ICD-10-CM | POA: Diagnosis not present

## 2020-04-14 DIAGNOSIS — E538 Deficiency of other specified B group vitamins: Secondary | ICD-10-CM | POA: Diagnosis not present

## 2020-04-22 DIAGNOSIS — E538 Deficiency of other specified B group vitamins: Secondary | ICD-10-CM | POA: Diagnosis not present

## 2020-04-28 DIAGNOSIS — E538 Deficiency of other specified B group vitamins: Secondary | ICD-10-CM | POA: Diagnosis not present

## 2020-05-05 DIAGNOSIS — E538 Deficiency of other specified B group vitamins: Secondary | ICD-10-CM | POA: Diagnosis not present

## 2020-05-07 DIAGNOSIS — H524 Presbyopia: Secondary | ICD-10-CM | POA: Diagnosis not present

## 2020-05-07 DIAGNOSIS — H2513 Age-related nuclear cataract, bilateral: Secondary | ICD-10-CM | POA: Diagnosis not present

## 2020-05-14 MED FILL — FLUoxetine HCL 20 MG CAPS: 20 | 30 days supply | Qty: 30 | Fill #4

## 2020-06-15 MED FILL — HYDROCHLOROTHIAZIDE 12.5 MG: 12.5 | 90 days supply | Qty: 90 | Fill #3

## 2020-06-15 MED FILL — FLUoxetine HCL 20 MG CAPS: 20 | 30 days supply | Qty: 30 | Fill #5

## 2020-07-29 ENCOUNTER — Other Ambulatory Visit (HOSPITAL_COMMUNITY): Payer: Self-pay | Admitting: Dermatology

## 2020-07-29 DIAGNOSIS — D1801 Hemangioma of skin and subcutaneous tissue: Secondary | ICD-10-CM | POA: Diagnosis not present

## 2020-07-29 DIAGNOSIS — L821 Other seborrheic keratosis: Secondary | ICD-10-CM | POA: Diagnosis not present

## 2020-07-29 DIAGNOSIS — L121 Cicatricial pemphigoid: Secondary | ICD-10-CM | POA: Diagnosis not present

## 2020-07-29 DIAGNOSIS — Z85828 Personal history of other malignant neoplasm of skin: Secondary | ICD-10-CM | POA: Diagnosis not present

## 2020-07-29 DIAGNOSIS — L814 Other melanin hyperpigmentation: Secondary | ICD-10-CM | POA: Diagnosis not present

## 2020-07-29 MED FILL — FLUoxetine HCL 20 MG CAPS: 20 | 30 days supply | Qty: 30 | Fill #6

## 2020-07-29 MED FILL — DOXYCYCLINE HYCLATE 100 MG: 100 | 76 days supply | Qty: 90 | Fill #0

## 2020-08-10 ENCOUNTER — Other Ambulatory Visit: Payer: Self-pay | Admitting: Family Medicine

## 2020-08-10 DIAGNOSIS — Z1231 Encounter for screening mammogram for malignant neoplasm of breast: Secondary | ICD-10-CM

## 2020-09-15 ENCOUNTER — Ambulatory Visit
Admission: RE | Admit: 2020-09-15 | Discharge: 2020-09-15 | Disposition: A | Discharge status: Home / Self Care | Payer: 59 | Source: Ambulatory Visit | Attending: Family Medicine | Admitting: Family Medicine

## 2020-09-15 ENCOUNTER — Other Ambulatory Visit: Payer: Self-pay

## 2020-09-15 DIAGNOSIS — Z1231 Encounter for screening mammogram for malignant neoplasm of breast: Secondary | ICD-10-CM

## 2020-09-30 MED FILL — FLUoxetine HCL 20 MG CAPS: 20 | 30 days supply | Qty: 30 | Fill #7

## 2020-10-19 MED FILL — DOXYCYCLINE HYCLATE 100 MG: 100 | 76 days supply | Qty: 90 | Fill #1

## 2020-11-26 DIAGNOSIS — Z20822 Contact with and (suspected) exposure to covid-19: Secondary | ICD-10-CM | POA: Diagnosis not present

## 2021-01-07 ENCOUNTER — Other Ambulatory Visit (HOSPITAL_BASED_OUTPATIENT_CLINIC_OR_DEPARTMENT_OTHER): Payer: Self-pay

## 2021-01-07 ENCOUNTER — Other Ambulatory Visit (HOSPITAL_COMMUNITY): Payer: Self-pay | Admitting: Ophthalmology

## 2021-01-07 DIAGNOSIS — H10502 Unspecified blepharoconjunctivitis, left eye: Secondary | ICD-10-CM | POA: Diagnosis not present

## 2021-01-07 MED FILL — TOBRAMYCIN-DEXAMETH OPTH SU: 0.3-0.1 | 7 days supply | Qty: 5 | Fill #0

## 2021-02-10 ENCOUNTER — Other Ambulatory Visit (HOSPITAL_COMMUNITY): Payer: Self-pay

## 2021-03-09 ENCOUNTER — Other Ambulatory Visit (HOSPITAL_COMMUNITY): Payer: Self-pay

## 2021-03-09 MED ORDER — FLUOXETINE HCL 20 MG PO CAPS
20.0000 mg | ORAL_CAPSULE | Freq: Every day | ORAL | 3 refills | Status: AC
  Filled 2021-03-09: qty 90, 90d supply, fill #0
  Filled 2021-07-03: qty 90, 90d supply, fill #1
  Filled 2022-01-11: qty 90, 90d supply, fill #2

## 2021-03-09 MED ORDER — ALPRAZOLAM 0.25 MG PO TABS
ORAL_TABLET | ORAL | 5 refills | Status: AC
  Filled 2021-03-09: qty 30, 30d supply, fill #0
  Filled 2021-04-28: qty 30, 30d supply, fill #1
  Filled 2021-05-31: qty 30, 30d supply, fill #2
  Filled 2021-07-03: qty 30, 30d supply, fill #3
  Filled 2021-08-03: qty 30, 30d supply, fill #4
  Filled 2021-09-03: qty 30, 30d supply, fill #5

## 2021-03-11 ENCOUNTER — Other Ambulatory Visit (HOSPITAL_COMMUNITY): Payer: Self-pay

## 2021-03-25 IMAGING — MG DIGITAL SCREENING BILAT W/ TOMO W/ CAD
8 series · 8 of 24 positions shown · non-contrast
Comparison: Previous exam(s).

CLINICAL DATA: Screening.

EXAM:
DIGITAL SCREENING BILATERAL MAMMOGRAM WITH TOMO AND CAD

[L CC synth-2D]
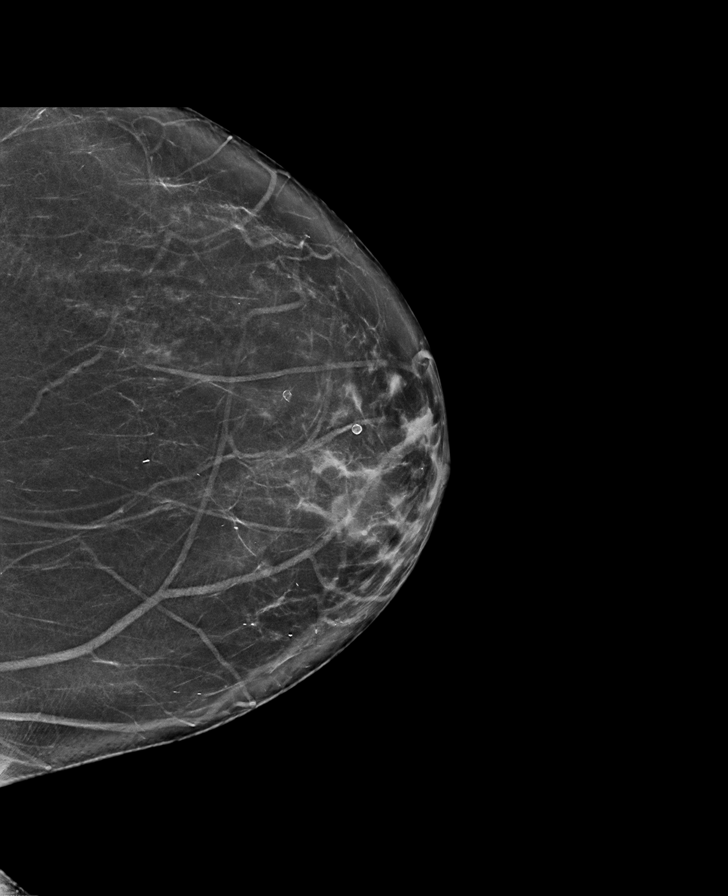

[L MLO synth-2D]
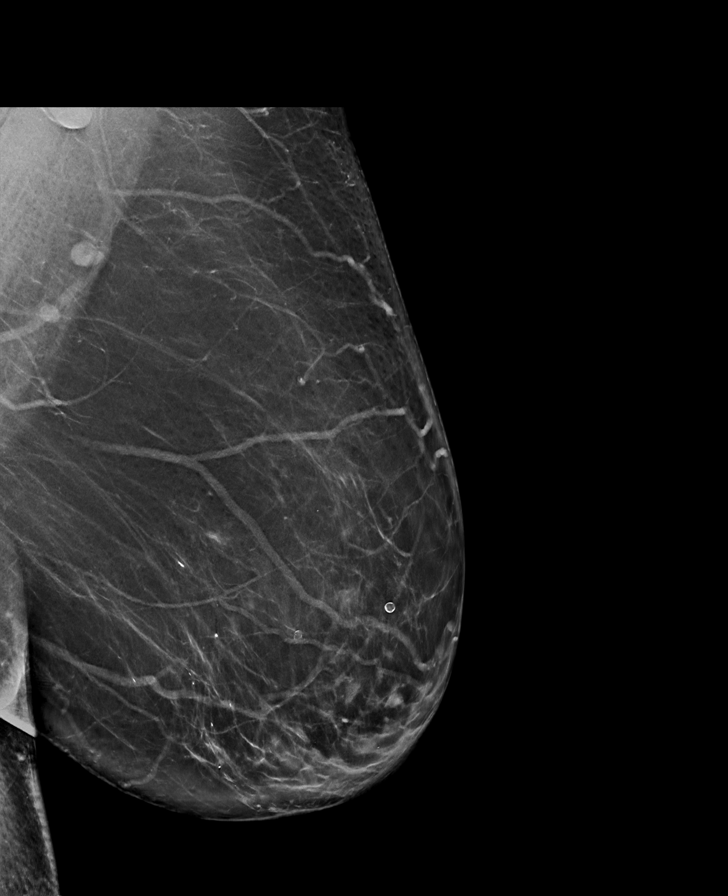

[R CC synth-2D]
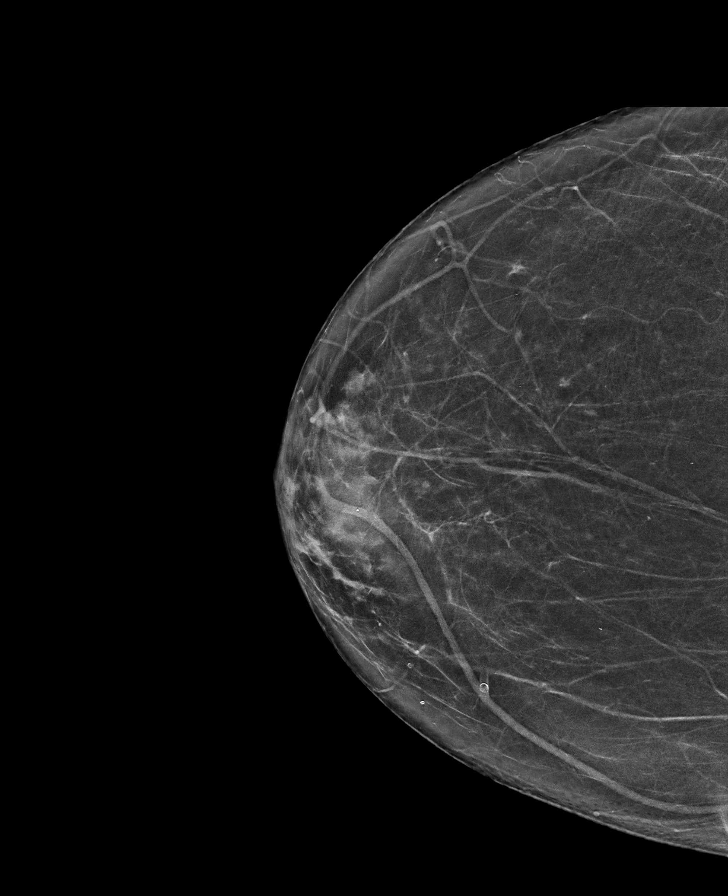

[R MLO synth-2D]
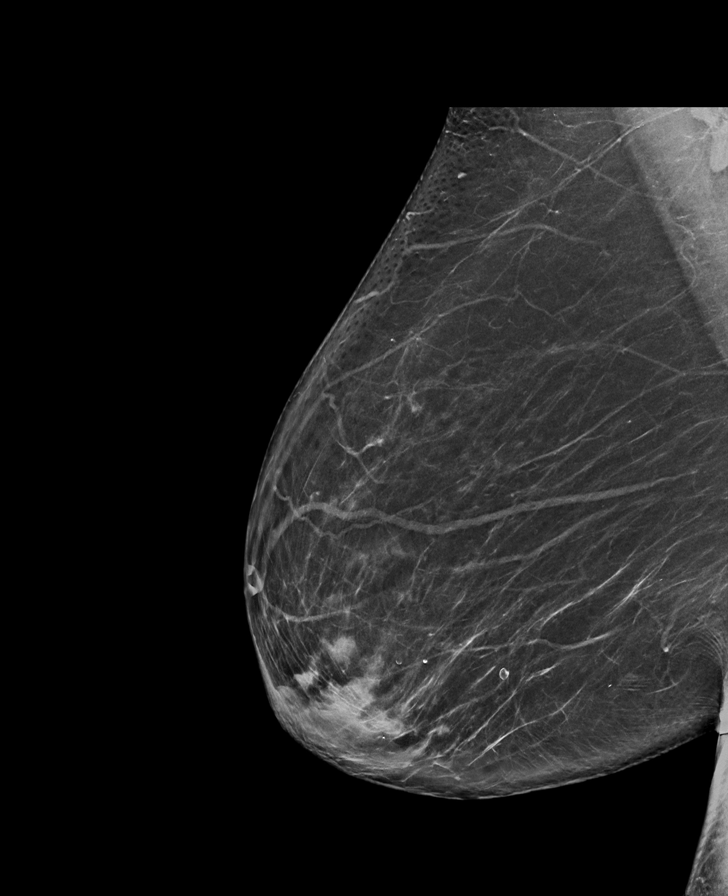

[R CC tomo · tomo slice 33/66.0]
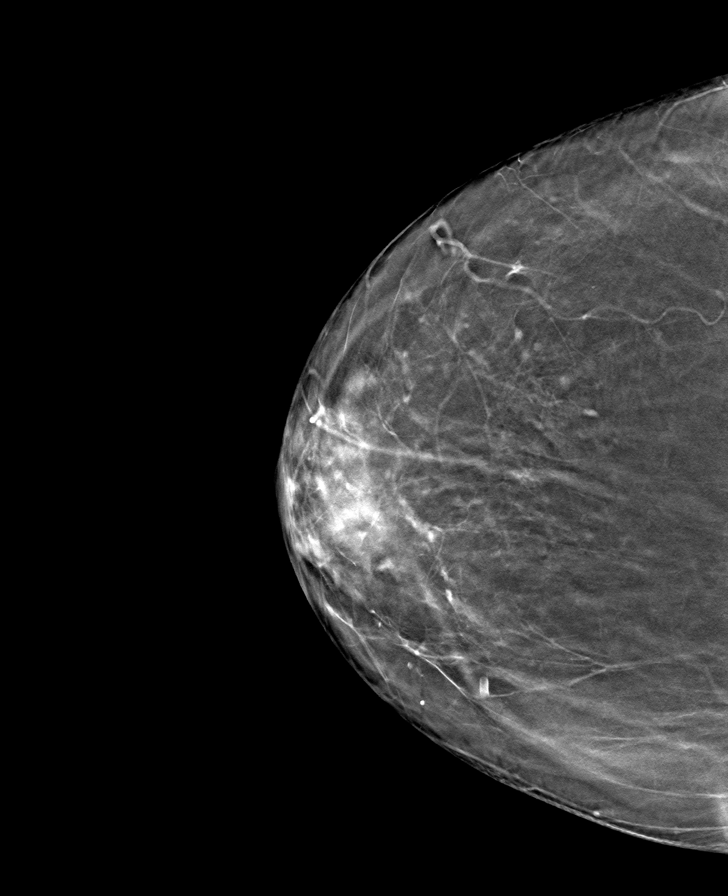

[L CC tomo · tomo slice 37/73.0]
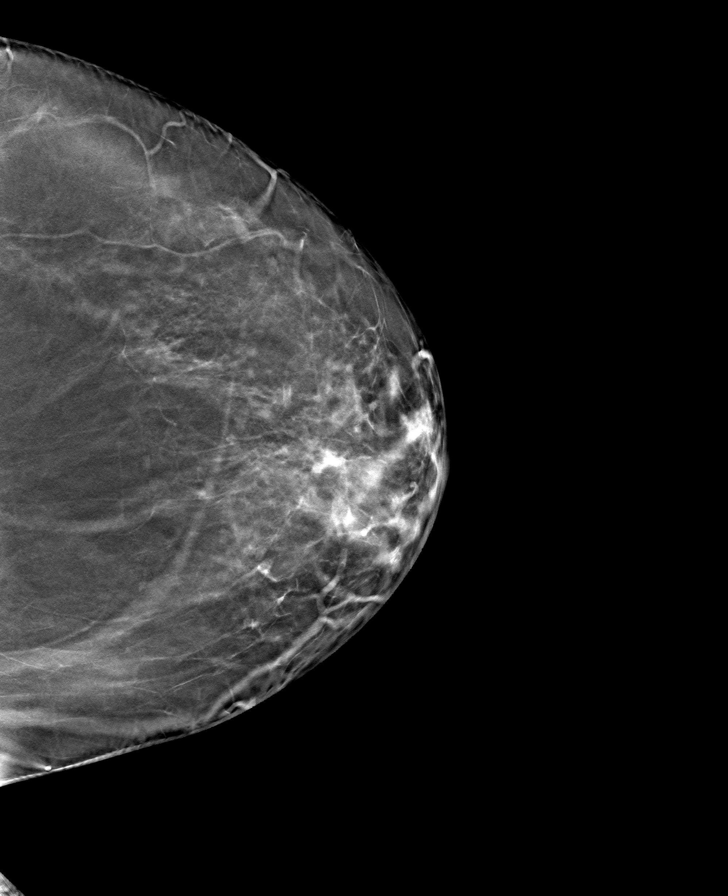

[L MLO tomo · tomo slice 44/87.0]
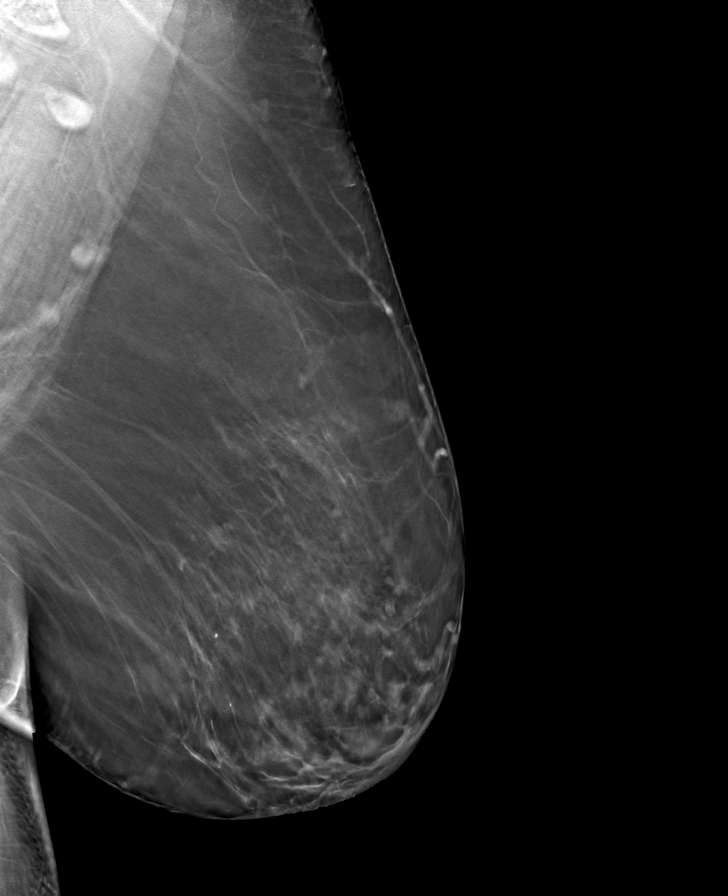

[R MLO tomo · tomo slice 40/79.0]
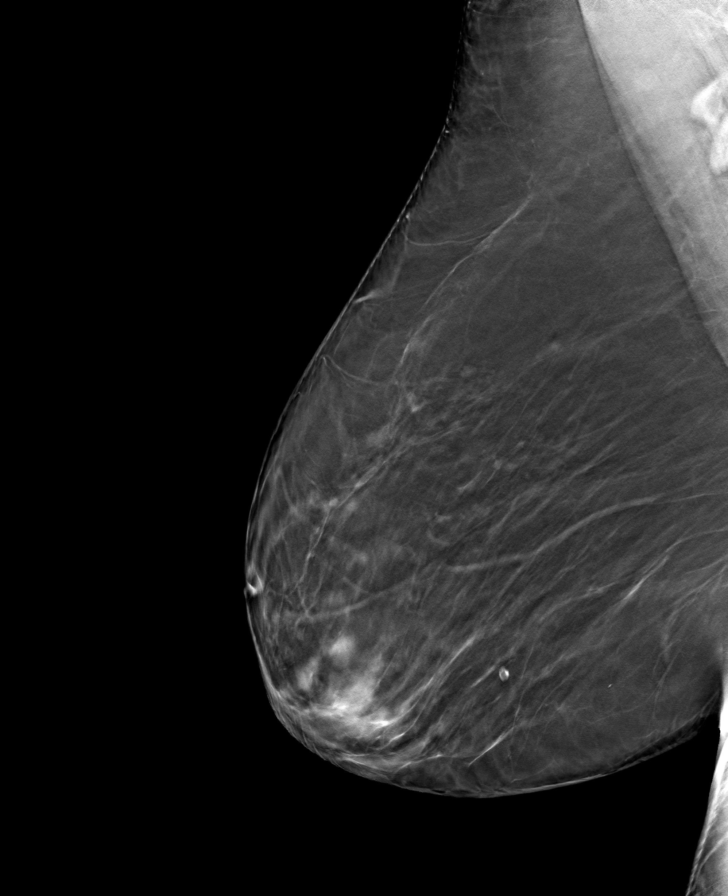

[8 of 24 positions shown; findings below may reference images not displayed]

ACR Breast Density Category b: There are scattered areas of
fibroglandular density.
FINDINGS: There are no findings suspicious for malignancy. Images were
processed with CAD.
IMPRESSION: No mammographic evidence of malignancy. A result letter of this
screening mammogram will be mailed directly to the patient.

RECOMMENDATION:
Screening mammogram in one year. (Code:[id number])

BI-RADS CATEGORY  1: Negative.

## 2021-04-26 DIAGNOSIS — J4520 Mild intermittent asthma, uncomplicated: Secondary | ICD-10-CM | POA: Diagnosis not present

## 2021-04-26 DIAGNOSIS — E538 Deficiency of other specified B group vitamins: Secondary | ICD-10-CM | POA: Diagnosis not present

## 2021-04-26 DIAGNOSIS — Z6841 Body Mass Index (BMI) 40.0 and over, adult: Secondary | ICD-10-CM | POA: Diagnosis not present

## 2021-04-26 DIAGNOSIS — E559 Vitamin D deficiency, unspecified: Secondary | ICD-10-CM | POA: Diagnosis not present

## 2021-04-26 DIAGNOSIS — R7303 Prediabetes: Secondary | ICD-10-CM | POA: Diagnosis not present

## 2021-04-26 DIAGNOSIS — Z0000 Encounter for general adult medical examination without abnormal findings: Secondary | ICD-10-CM | POA: Diagnosis not present

## 2021-04-26 DIAGNOSIS — I10 Essential (primary) hypertension: Secondary | ICD-10-CM | POA: Diagnosis not present

## 2021-04-26 DIAGNOSIS — E782 Mixed hyperlipidemia: Secondary | ICD-10-CM | POA: Diagnosis not present

## 2021-04-28 ENCOUNTER — Other Ambulatory Visit (HOSPITAL_COMMUNITY): Payer: Self-pay

## 2021-04-28 MED ORDER — ALBUTEROL SULFATE HFA 108 (90 BASE) MCG/ACT IN AERS
INHALATION_SPRAY | RESPIRATORY_TRACT | 0 refills | Status: AC
  Filled 2021-04-28: qty 18, 16d supply, fill #0

## 2021-04-28 MED ORDER — MOMETASONE FUROATE 50 MCG/ACT NA SUSP
NASAL | 0 refills | Status: DC
  Filled 2021-04-28: qty 17, 30d supply, fill #0

## 2021-04-29 ENCOUNTER — Other Ambulatory Visit (HOSPITAL_COMMUNITY): Payer: Self-pay

## 2021-05-04 ENCOUNTER — Other Ambulatory Visit (HOSPITAL_COMMUNITY): Payer: Self-pay

## 2021-05-04 MED ORDER — ESOMEPRAZOLE MAGNESIUM 40 MG PO CPDR
DELAYED_RELEASE_CAPSULE | ORAL | 3 refills | Status: DC
  Filled 2021-05-04: qty 90, 90d supply, fill #0
  Filled 2021-09-03: qty 90, 90d supply, fill #1
  Filled 2022-01-11: qty 90, 90d supply, fill #2
  Filled 2022-04-11: qty 90, 90d supply, fill #3

## 2021-05-05 ENCOUNTER — Other Ambulatory Visit (HOSPITAL_COMMUNITY): Payer: Self-pay

## 2021-05-05 MED ORDER — FLUTICASONE PROPIONATE 50 MCG/ACT NA SUSP
NASAL | 3 refills | Status: DC
  Filled 2021-05-05: qty 16, 60d supply, fill #0
  Filled 2022-04-11: qty 16, 60d supply, fill #1

## 2021-05-27 DIAGNOSIS — H5203 Hypermetropia, bilateral: Secondary | ICD-10-CM | POA: Diagnosis not present

## 2021-05-27 DIAGNOSIS — H25013 Cortical age-related cataract, bilateral: Secondary | ICD-10-CM | POA: Diagnosis not present

## 2021-05-27 DIAGNOSIS — H2513 Age-related nuclear cataract, bilateral: Secondary | ICD-10-CM | POA: Diagnosis not present

## 2021-05-27 DIAGNOSIS — H524 Presbyopia: Secondary | ICD-10-CM | POA: Diagnosis not present

## 2021-05-31 ENCOUNTER — Other Ambulatory Visit (HOSPITAL_COMMUNITY): Payer: Self-pay

## 2021-07-03 ENCOUNTER — Other Ambulatory Visit (HOSPITAL_COMMUNITY): Payer: Self-pay

## 2021-07-03 MED FILL — Doxycycline Hyclate Tab 100 MG: ORAL | 76 days supply | Qty: 90 | Fill #0 | Status: AC

## 2021-08-04 ENCOUNTER — Other Ambulatory Visit (HOSPITAL_COMMUNITY): Payer: Self-pay

## 2021-09-03 ENCOUNTER — Other Ambulatory Visit (HOSPITAL_COMMUNITY): Payer: Self-pay

## 2021-09-06 ENCOUNTER — Other Ambulatory Visit (HOSPITAL_COMMUNITY): Payer: Self-pay

## 2021-09-07 ENCOUNTER — Other Ambulatory Visit (HOSPITAL_COMMUNITY): Payer: Self-pay

## 2021-09-08 ENCOUNTER — Other Ambulatory Visit (HOSPITAL_COMMUNITY): Payer: Self-pay

## 2021-09-16 ENCOUNTER — Other Ambulatory Visit (HOSPITAL_COMMUNITY): Payer: Self-pay

## 2021-09-20 ENCOUNTER — Other Ambulatory Visit (HOSPITAL_COMMUNITY): Payer: Self-pay

## 2021-12-08 ENCOUNTER — Other Ambulatory Visit (HOSPITAL_COMMUNITY): Payer: Self-pay

## 2021-12-08 DIAGNOSIS — L814 Other melanin hyperpigmentation: Secondary | ICD-10-CM | POA: Diagnosis not present

## 2021-12-08 DIAGNOSIS — Z85828 Personal history of other malignant neoplasm of skin: Secondary | ICD-10-CM | POA: Diagnosis not present

## 2021-12-08 DIAGNOSIS — L121 Cicatricial pemphigoid: Secondary | ICD-10-CM | POA: Diagnosis not present

## 2021-12-08 DIAGNOSIS — L821 Other seborrheic keratosis: Secondary | ICD-10-CM | POA: Diagnosis not present

## 2021-12-08 DIAGNOSIS — L918 Other hypertrophic disorders of the skin: Secondary | ICD-10-CM | POA: Diagnosis not present

## 2021-12-08 DIAGNOSIS — D225 Melanocytic nevi of trunk: Secondary | ICD-10-CM | POA: Diagnosis not present

## 2021-12-08 DIAGNOSIS — D1801 Hemangioma of skin and subcutaneous tissue: Secondary | ICD-10-CM | POA: Diagnosis not present

## 2021-12-08 DIAGNOSIS — D2271 Melanocytic nevi of right lower limb, including hip: Secondary | ICD-10-CM | POA: Diagnosis not present

## 2021-12-08 MED ORDER — DOXYCYCLINE HYCLATE 100 MG PO TABS
100.0000 mg | ORAL_TABLET | ORAL | 2 refills | Status: DC
  Filled 2021-12-08: qty 15, 30d supply, fill #0
  Filled 2022-09-18: qty 15, 30d supply, fill #1
  Filled 2022-11-30: qty 15, 30d supply, fill #2

## 2021-12-08 MED ORDER — FLUOCINONIDE 0.05 % EX GEL
CUTANEOUS | 2 refills | Status: AC
  Filled 2021-12-08: qty 30, 10d supply, fill #0
  Filled 2022-09-18: qty 30, 10d supply, fill #1

## 2021-12-09 ENCOUNTER — Other Ambulatory Visit (HOSPITAL_COMMUNITY): Payer: Self-pay

## 2022-01-11 ENCOUNTER — Other Ambulatory Visit (HOSPITAL_COMMUNITY): Payer: Self-pay

## 2022-04-11 ENCOUNTER — Other Ambulatory Visit (HOSPITAL_COMMUNITY): Payer: Self-pay

## 2022-05-30 DIAGNOSIS — H5203 Hypermetropia, bilateral: Secondary | ICD-10-CM | POA: Diagnosis not present

## 2022-05-30 DIAGNOSIS — H25813 Combined forms of age-related cataract, bilateral: Secondary | ICD-10-CM | POA: Diagnosis not present

## 2022-05-30 DIAGNOSIS — H524 Presbyopia: Secondary | ICD-10-CM | POA: Diagnosis not present

## 2022-06-02 ENCOUNTER — Other Ambulatory Visit (HOSPITAL_COMMUNITY): Payer: Self-pay

## 2022-06-02 DIAGNOSIS — Z0000 Encounter for general adult medical examination without abnormal findings: Secondary | ICD-10-CM | POA: Diagnosis not present

## 2022-06-02 DIAGNOSIS — Z1211 Encounter for screening for malignant neoplasm of colon: Secondary | ICD-10-CM | POA: Diagnosis not present

## 2022-06-02 DIAGNOSIS — R7303 Prediabetes: Secondary | ICD-10-CM | POA: Diagnosis not present

## 2022-06-02 DIAGNOSIS — E559 Vitamin D deficiency, unspecified: Secondary | ICD-10-CM | POA: Diagnosis not present

## 2022-06-02 DIAGNOSIS — R1310 Dysphagia, unspecified: Secondary | ICD-10-CM | POA: Diagnosis not present

## 2022-06-02 DIAGNOSIS — F419 Anxiety disorder, unspecified: Secondary | ICD-10-CM | POA: Diagnosis not present

## 2022-06-02 DIAGNOSIS — I10 Essential (primary) hypertension: Secondary | ICD-10-CM | POA: Diagnosis not present

## 2022-06-02 DIAGNOSIS — J45909 Unspecified asthma, uncomplicated: Secondary | ICD-10-CM | POA: Diagnosis not present

## 2022-06-02 DIAGNOSIS — E782 Mixed hyperlipidemia: Secondary | ICD-10-CM | POA: Diagnosis not present

## 2022-06-02 MED ORDER — FLUTICASONE PROPIONATE 50 MCG/ACT NA SUSP
NASAL | 11 refills | Status: DC
  Filled 2022-06-02: qty 16, 30d supply, fill #0
  Filled 2022-09-18: qty 16, 30d supply, fill #1

## 2022-06-02 MED ORDER — ALBUTEROL SULFATE HFA 108 (90 BASE) MCG/ACT IN AERS
INHALATION_SPRAY | RESPIRATORY_TRACT | 1 refills | Status: DC
  Filled 2022-06-02: qty 6.7, 16d supply, fill #0
  Filled 2022-09-18: qty 6.7, 16d supply, fill #1

## 2022-06-02 MED ORDER — ALPRAZOLAM 0.25 MG PO TABS
ORAL_TABLET | ORAL | 0 refills | Status: DC
  Filled 2022-06-02: qty 30, 30d supply, fill #0

## 2022-06-02 MED ORDER — MELOXICAM 15 MG PO TABS
ORAL_TABLET | ORAL | 3 refills | Status: DC
  Filled 2022-06-02: qty 60, 90d supply, fill #0
  Filled 2022-09-18: qty 60, 90d supply, fill #1

## 2022-06-02 MED ORDER — TRIAMTERENE-HCTZ 37.5-25 MG PO TABS
ORAL_TABLET | ORAL | 0 refills | Status: DC
  Filled 2022-06-02: qty 90, 90d supply, fill #0

## 2022-06-02 MED ORDER — ESOMEPRAZOLE MAGNESIUM 40 MG PO CPDR
40.0000 mg | DELAYED_RELEASE_CAPSULE | Freq: Every day | ORAL | 3 refills | Status: DC
  Filled 2022-06-02 – 2022-08-16 (×2): qty 90, 90d supply, fill #0

## 2022-06-02 MED ORDER — FLUOXETINE HCL 20 MG PO CAPS
ORAL_CAPSULE | ORAL | 3 refills | Status: DC
  Filled 2022-06-02: qty 90, 90d supply, fill #0
  Filled 2022-09-18: qty 90, 90d supply, fill #1

## 2022-06-02 MED ORDER — ROSUVASTATIN CALCIUM 5 MG PO TABS
ORAL_TABLET | ORAL | 0 refills | Status: DC
  Filled 2022-06-02: qty 90, 90d supply, fill #0

## 2022-06-03 ENCOUNTER — Other Ambulatory Visit: Payer: Self-pay | Admitting: Family Medicine

## 2022-06-03 DIAGNOSIS — Z1231 Encounter for screening mammogram for malignant neoplasm of breast: Secondary | ICD-10-CM

## 2022-06-06 ENCOUNTER — Other Ambulatory Visit (HOSPITAL_BASED_OUTPATIENT_CLINIC_OR_DEPARTMENT_OTHER): Payer: Self-pay | Admitting: Family Medicine

## 2022-06-06 ENCOUNTER — Other Ambulatory Visit: Payer: Self-pay | Admitting: Family Medicine

## 2022-06-06 DIAGNOSIS — E782 Mixed hyperlipidemia: Secondary | ICD-10-CM

## 2022-06-13 DIAGNOSIS — R252 Cramp and spasm: Secondary | ICD-10-CM | POA: Diagnosis not present

## 2022-06-13 DIAGNOSIS — M25571 Pain in right ankle and joints of right foot: Secondary | ICD-10-CM | POA: Diagnosis not present

## 2022-06-21 DIAGNOSIS — M25571 Pain in right ankle and joints of right foot: Secondary | ICD-10-CM | POA: Diagnosis not present

## 2022-06-28 ENCOUNTER — Ambulatory Visit
Admission: RE | Admit: 2022-06-28 | Discharge: 2022-06-28 | Disposition: A | Discharge status: Home / Self Care | Payer: 59 | Source: Ambulatory Visit | Attending: Family Medicine | Admitting: Family Medicine

## 2022-06-28 DIAGNOSIS — Z1231 Encounter for screening mammogram for malignant neoplasm of breast: Secondary | ICD-10-CM

## 2022-06-30 DIAGNOSIS — Z79899 Other long term (current) drug therapy: Secondary | ICD-10-CM | POA: Diagnosis not present

## 2022-06-30 DIAGNOSIS — E782 Mixed hyperlipidemia: Secondary | ICD-10-CM | POA: Diagnosis not present

## 2022-06-30 DIAGNOSIS — Z23 Encounter for immunization: Secondary | ICD-10-CM | POA: Diagnosis not present

## 2022-07-01 ENCOUNTER — Encounter: Payer: Self-pay | Admitting: Internal Medicine

## 2022-07-06 ENCOUNTER — Encounter (HOSPITAL_BASED_OUTPATIENT_CLINIC_OR_DEPARTMENT_OTHER): Payer: Self-pay

## 2022-07-06 ENCOUNTER — Ambulatory Visit (HOSPITAL_BASED_OUTPATIENT_CLINIC_OR_DEPARTMENT_OTHER)
Admission: RE | Admit: 2022-07-06 | Discharge: 2022-07-06 | Disposition: A | Discharge status: Home / Self Care | Payer: 59 | Source: Ambulatory Visit | Attending: Family Medicine | Admitting: Family Medicine

## 2022-07-06 DIAGNOSIS — E782 Mixed hyperlipidemia: Secondary | ICD-10-CM | POA: Insufficient documentation

## 2022-07-14 ENCOUNTER — Other Ambulatory Visit: Payer: Self-pay | Admitting: Physician Assistant

## 2022-07-14 DIAGNOSIS — K219 Gastro-esophageal reflux disease without esophagitis: Secondary | ICD-10-CM | POA: Diagnosis not present

## 2022-07-14 DIAGNOSIS — R1310 Dysphagia, unspecified: Secondary | ICD-10-CM

## 2022-07-14 DIAGNOSIS — Z86010 Personal history of colonic polyps: Secondary | ICD-10-CM | POA: Diagnosis not present

## 2022-07-25 ENCOUNTER — Other Ambulatory Visit: Payer: 59

## 2022-07-27 ENCOUNTER — Ambulatory Visit
Admission: RE | Admit: 2022-07-27 | Discharge: 2022-07-27 | Disposition: A | Discharge status: Home / Self Care | Payer: 59 | Source: Ambulatory Visit | Attending: Physician Assistant | Admitting: Physician Assistant

## 2022-07-27 DIAGNOSIS — K224 Dyskinesia of esophagus: Secondary | ICD-10-CM | POA: Diagnosis not present

## 2022-07-27 DIAGNOSIS — K449 Diaphragmatic hernia without obstruction or gangrene: Secondary | ICD-10-CM | POA: Diagnosis not present

## 2022-07-27 DIAGNOSIS — R1310 Dysphagia, unspecified: Secondary | ICD-10-CM | POA: Diagnosis not present

## 2022-08-15 ENCOUNTER — Other Ambulatory Visit (HOSPITAL_COMMUNITY): Payer: Self-pay

## 2022-08-15 MED ORDER — PEG 3350-KCL-NA BICARB-NACL 420 G PO SOLR
ORAL | 0 refills | Status: DC
  Filled 2022-08-15: qty 4000, 1d supply, fill #0

## 2022-08-16 ENCOUNTER — Other Ambulatory Visit (HOSPITAL_COMMUNITY): Payer: Self-pay

## 2022-08-23 DIAGNOSIS — K649 Unspecified hemorrhoids: Secondary | ICD-10-CM | POA: Diagnosis not present

## 2022-08-23 DIAGNOSIS — R1310 Dysphagia, unspecified: Secondary | ICD-10-CM | POA: Diagnosis not present

## 2022-08-23 DIAGNOSIS — K5730 Diverticulosis of large intestine without perforation or abscess without bleeding: Secondary | ICD-10-CM | POA: Diagnosis not present

## 2022-08-23 DIAGNOSIS — K2980 Duodenitis without bleeding: Secondary | ICD-10-CM | POA: Diagnosis not present

## 2022-08-23 DIAGNOSIS — Z86010 Personal history of colonic polyps: Secondary | ICD-10-CM | POA: Diagnosis not present

## 2022-09-18 ENCOUNTER — Other Ambulatory Visit (HOSPITAL_COMMUNITY): Payer: Self-pay

## 2022-09-19 ENCOUNTER — Other Ambulatory Visit (HOSPITAL_COMMUNITY): Payer: Self-pay

## 2022-09-19 MED ORDER — TRIAMTERENE-HCTZ 37.5-25 MG PO TABS
1.0000 | ORAL_TABLET | Freq: Every day | ORAL | 0 refills | Status: DC
  Filled 2022-09-19: qty 90, 90d supply, fill #0

## 2022-09-19 MED ORDER — ROSUVASTATIN CALCIUM 5 MG PO TABS
5.0000 mg | ORAL_TABLET | Freq: Every day | ORAL | 3 refills | Status: DC
  Filled 2022-09-19: qty 90, 90d supply, fill #0
  Filled 2022-11-30: qty 90, 90d supply, fill #1

## 2022-09-19 MED ORDER — ALPRAZOLAM 0.25 MG PO TABS
0.2500 mg | ORAL_TABLET | Freq: Every day | ORAL | 0 refills | Status: AC | PRN
  Filled 2022-09-19: qty 30, 30d supply, fill #0

## 2022-09-23 DIAGNOSIS — Z23 Encounter for immunization: Secondary | ICD-10-CM | POA: Diagnosis not present

## 2022-11-01 DIAGNOSIS — E782 Mixed hyperlipidemia: Secondary | ICD-10-CM | POA: Diagnosis not present

## 2022-11-30 ENCOUNTER — Other Ambulatory Visit: Payer: Self-pay

## 2022-11-30 ENCOUNTER — Other Ambulatory Visit (HOSPITAL_COMMUNITY): Payer: Self-pay

## 2022-12-01 ENCOUNTER — Other Ambulatory Visit: Payer: Self-pay

## 2022-12-01 ENCOUNTER — Other Ambulatory Visit (HOSPITAL_COMMUNITY): Payer: Self-pay

## 2022-12-01 MED ORDER — TRIAMTERENE-HCTZ 37.5-25 MG PO TABS
1.0000 | ORAL_TABLET | Freq: Every morning | ORAL | 3 refills | Status: DC
  Filled 2022-12-01: qty 90, 90d supply, fill #0
  Filled 2023-11-30: qty 90, 90d supply, fill #1

## 2023-01-10 ENCOUNTER — Other Ambulatory Visit (HOSPITAL_COMMUNITY): Payer: Self-pay

## 2023-01-10 DIAGNOSIS — D1801 Hemangioma of skin and subcutaneous tissue: Secondary | ICD-10-CM | POA: Diagnosis not present

## 2023-01-10 DIAGNOSIS — L120 Bullous pemphigoid: Secondary | ICD-10-CM | POA: Diagnosis not present

## 2023-01-10 DIAGNOSIS — Z85828 Personal history of other malignant neoplasm of skin: Secondary | ICD-10-CM | POA: Diagnosis not present

## 2023-01-10 DIAGNOSIS — D225 Melanocytic nevi of trunk: Secondary | ICD-10-CM | POA: Diagnosis not present

## 2023-01-10 DIAGNOSIS — L821 Other seborrheic keratosis: Secondary | ICD-10-CM | POA: Diagnosis not present

## 2023-01-10 DIAGNOSIS — L820 Inflamed seborrheic keratosis: Secondary | ICD-10-CM | POA: Diagnosis not present

## 2023-01-10 MED ORDER — FLUCONAZOLE 150 MG PO TABS
150.0000 mg | ORAL_TABLET | ORAL | 0 refills | Status: AC
  Filled 2023-01-10: qty 2, 14d supply, fill #0

## 2023-02-02 DIAGNOSIS — E782 Mixed hyperlipidemia: Secondary | ICD-10-CM | POA: Diagnosis not present

## 2023-02-15 ENCOUNTER — Other Ambulatory Visit (HOSPITAL_COMMUNITY): Payer: Self-pay

## 2023-02-15 ENCOUNTER — Other Ambulatory Visit: Payer: Self-pay

## 2023-02-15 MED ORDER — ALPRAZOLAM 0.25 MG PO TABS
0.2500 mg | ORAL_TABLET | Freq: Every day | ORAL | 0 refills | Status: AC | PRN
  Filled 2023-02-15: qty 30, 30d supply, fill #0

## 2023-05-03 DIAGNOSIS — C44529 Squamous cell carcinoma of skin of other part of trunk: Secondary | ICD-10-CM | POA: Diagnosis not present

## 2023-05-03 DIAGNOSIS — D485 Neoplasm of uncertain behavior of skin: Secondary | ICD-10-CM | POA: Diagnosis not present

## 2023-05-31 DIAGNOSIS — L905 Scar conditions and fibrosis of skin: Secondary | ICD-10-CM | POA: Diagnosis not present

## 2023-05-31 DIAGNOSIS — C44529 Squamous cell carcinoma of skin of other part of trunk: Secondary | ICD-10-CM | POA: Diagnosis not present

## 2023-06-07 DIAGNOSIS — Z0000 Encounter for general adult medical examination without abnormal findings: Secondary | ICD-10-CM | POA: Diagnosis not present

## 2023-06-07 DIAGNOSIS — Z23 Encounter for immunization: Secondary | ICD-10-CM | POA: Diagnosis not present

## 2023-06-07 DIAGNOSIS — E2839 Other primary ovarian failure: Secondary | ICD-10-CM | POA: Diagnosis not present

## 2023-06-07 DIAGNOSIS — R7303 Prediabetes: Secondary | ICD-10-CM | POA: Diagnosis not present

## 2023-06-07 DIAGNOSIS — F419 Anxiety disorder, unspecified: Secondary | ICD-10-CM | POA: Diagnosis not present

## 2023-06-07 DIAGNOSIS — R252 Cramp and spasm: Secondary | ICD-10-CM | POA: Diagnosis not present

## 2023-06-07 DIAGNOSIS — E559 Vitamin D deficiency, unspecified: Secondary | ICD-10-CM | POA: Diagnosis not present

## 2023-06-07 DIAGNOSIS — R931 Abnormal findings on diagnostic imaging of heart and coronary circulation: Secondary | ICD-10-CM | POA: Diagnosis not present

## 2023-06-07 DIAGNOSIS — E782 Mixed hyperlipidemia: Secondary | ICD-10-CM | POA: Diagnosis not present

## 2023-06-07 DIAGNOSIS — I10 Essential (primary) hypertension: Secondary | ICD-10-CM | POA: Diagnosis not present

## 2023-06-07 DIAGNOSIS — I700 Atherosclerosis of aorta: Secondary | ICD-10-CM | POA: Diagnosis not present

## 2023-06-08 ENCOUNTER — Other Ambulatory Visit (HOSPITAL_COMMUNITY): Payer: Self-pay

## 2023-06-08 ENCOUNTER — Other Ambulatory Visit: Payer: Self-pay

## 2023-06-08 MED ORDER — ALPRAZOLAM 0.25 MG PO TABS
0.2500 mg | ORAL_TABLET | Freq: Every day | ORAL | 0 refills | Status: DC | PRN
  Filled 2023-06-08: qty 30, 30d supply, fill #0

## 2023-06-12 ENCOUNTER — Other Ambulatory Visit: Payer: Self-pay | Admitting: Family Medicine

## 2023-06-12 DIAGNOSIS — E2839 Other primary ovarian failure: Secondary | ICD-10-CM

## 2023-07-24 DIAGNOSIS — Z08 Encounter for follow-up examination after completed treatment for malignant neoplasm: Secondary | ICD-10-CM | POA: Diagnosis not present

## 2023-07-24 DIAGNOSIS — Z85828 Personal history of other malignant neoplasm of skin: Secondary | ICD-10-CM | POA: Diagnosis not present

## 2023-07-24 DIAGNOSIS — L814 Other melanin hyperpigmentation: Secondary | ICD-10-CM | POA: Diagnosis not present

## 2023-07-24 DIAGNOSIS — D225 Melanocytic nevi of trunk: Secondary | ICD-10-CM | POA: Diagnosis not present

## 2023-07-24 DIAGNOSIS — L821 Other seborrheic keratosis: Secondary | ICD-10-CM | POA: Diagnosis not present

## 2023-10-25 ENCOUNTER — Ambulatory Visit (INDEPENDENT_AMBULATORY_CARE_PROVIDER_SITE_OTHER): Payer: PPO | Admitting: Internal Medicine

## 2023-10-25 ENCOUNTER — Encounter (HOSPITAL_BASED_OUTPATIENT_CLINIC_OR_DEPARTMENT_OTHER): Payer: Self-pay | Admitting: Internal Medicine

## 2023-10-25 VITALS — BP 132/70 | HR 106 | Resp 16 | Ht 65.0 in | Wt 224.0 lb

## 2023-10-25 DIAGNOSIS — E7800 Pure hypercholesterolemia, unspecified: Secondary | ICD-10-CM | POA: Diagnosis not present

## 2023-10-25 DIAGNOSIS — M791 Myalgia, unspecified site: Secondary | ICD-10-CM | POA: Diagnosis not present

## 2023-10-25 DIAGNOSIS — I10 Essential (primary) hypertension: Secondary | ICD-10-CM

## 2023-10-25 DIAGNOSIS — T466X5A Adverse effect of antihyperlipidemic and antiarteriosclerotic drugs, initial encounter: Secondary | ICD-10-CM

## 2023-10-25 DIAGNOSIS — T466X5D Adverse effect of antihyperlipidemic and antiarteriosclerotic drugs, subsequent encounter: Secondary | ICD-10-CM

## 2023-10-25 DIAGNOSIS — I2510 Atherosclerotic heart disease of native coronary artery without angina pectoris: Secondary | ICD-10-CM

## 2023-10-25 NOTE — Patient Instructions (Signed)
 Medication Instructions:  Dr. Mona recommends Repatha  Sureclick 140mg /mL (PCSK9). This is an injectable cholesterol medication self-administered once every 14 days. This medication will likely need prior approval with your insurance company, which we will work on. If the medication is not approved initially, we may need to do an appeal with your insurance.   Administer medication in area of fatty tissue such as abdomen, outer thigh, back of upper arm - and rotate site with each injection Store medication in refrigerator until ready to administer - allow to sit at room temp for 30 mins - 1 hour prior to injection Dispose of medication in a SHARPS container - your pharmacy should be able to direct you on this and proper disposal   If you need a co-pay card for Repatha : https://www.repatha .com/repatha -cost If you need a co-pay card for Praluent: https://[email or web address].https://sullivan-[email or web address]/  Patient Assistance:    These foundations have funds at various times.   The [employer] Foundation: https://www.panfoundation.org/disease-funds/hypercholesterolemia/ -- can sign up for wait list  [facility] offers assistance to help pay for medication copays.  They will cover copays for all cholesterol lowering meds, including statins, fibrates, omega-3 fish oils like Vascepa, ezetimibe , Repatha , Praluent, Nexletol, Nexlizet.  The cards are usually good for $2,500 or 12 months, whichever comes first. Our fax # is [phone] (you will need this to apply) Go to healthwellfoundation.org Click on "Apply Now" Answer questions as to whom is applying (patient or representative) Your disease fund will be "hypercholesterolemia - Medicare access" They will ask questions about finances and which medications you are taking for cholesterol When you submit, the approval is usually within minutes.  You will need to print the card information from the site You will need to show this information to your pharmacy,  they will bill your Medicare Part D plan first -then bill [employer] --for the copay.   You can also call them at [phone], although the hold times can be quite long.     *If you need a refill on your cardiac medications before your next appointment, please call your pharmacy*   Lab Work: FASTING NMR lipoprofile and LPa in 3-4 months ** complete ONE WEEK before next appointment  If you have labs (blood work) drawn today and your tests are completely normal, you will receive your results only by: MyChart Message (if you have MyChart) OR A paper copy in the mail If you have any lab test that is abnormal or we need to change your treatment, we will call you to review the results.  Follow-Up: At [facility], you and your health needs are our priority.  As part of our continuing mission to provide you with exceptional heart care, we have created designated Provider Care Teams.  These Care Teams include your primary Cardiologist (physician) and Advanced Practice Providers (APPs -  Physician Assistants and Nurse Practitioners) who all work together to provide you with the care you need, when you need it.  We recommend signing up for the patient portal called MyChart.  Sign up information is provided on this After Visit Summary.  MyChart is used to connect with patients for Virtual Visits (Telemedicine).  Patients are able to view lab/test results, encounter notes, upcoming appointments, etc.  Non-urgent messages can be sent to your provider as well.   To learn more about what you can do with MyChart, go to [email or web address].    Your next appointment:    3-4 months with Dr. Mona

## 2023-10-25 NOTE — Progress Notes (Signed)
 LIPID CLINIC CONSULT NOTE  Chief Complaint:  Manage dyslipidemia  Primary Care Physician: Dyane Anthony RAMAN, FNP  Primary Cardiologist:  None  HPI:  Kristy Colon is a 68 y.o. female who is being seen today for the evaluation of dyslipidemia at the request of Dyane Anthony RAMAN, FNP.  This a pleasant 68 year old female kindly referred for evaluation and management of dyslipidemia.  She has a history of statin intolerance having previously tried simvastatin, atorvastatin , rosuvastatin  and pravastatin.  These all caused significant myalgias.  She does have a history of coronary artery calcification with a calcium  score of 27, 70th percentile for age and sex matched controls and aortic atherosclerosis back in 2023.  Her most recent lipids from August 20 24 showed total cholesterol 286, HDL 41, triglycerides 256 and LDL 195.  This is concerning for possible familial hyperlipidemia although she did report her diet being higher and saturated fats.  PMHx:  Past Medical History:  Diagnosis Date   Asthma    Hyperlipemia    Hypertension     Past Surgical History:  Procedure Laterality Date   ABDOMINAL HYSTERECTOMY     CHOLECYSTECTOMY      FAMHx:  Family History  Problem Relation Age of Onset   COPD Brother    Diabetes Other    Cancer Other     SOCHx:   reports that she has quit smoking. Her smoking use included cigarettes. She started smoking about 18 years ago. She has a 9 pack-year smoking history. She has never used smokeless tobacco. She reports that she does not drink alcohol and does not use drugs.  ALLERGIES:  Allergies  Allergen Reactions   Statins     Simvastatin, Pravastatin, Rosuvastatin , Atorvastatin  - myalgias    ROS: Pertinent items noted in HPI and remainder of comprehensive ROS otherwise negative.  HOME MEDS: Current Outpatient Medications on File Prior to Visit  Medication Sig Dispense Refill   albuterol  (PROAIR  HFA) 108 (90 Base) MCG/ACT inhaler Inhale 2  puffs into the lungs every 4 hours if needed for wheezing 18 g 0   albuterol  (PROAIR  HFA) 108 (90 Base) MCG/ACT inhaler Inhale 2 puffs every 4 hours if needed for wheezing 30 days 6.7 g 1   ALPRAZolam  (XANAX ) 0.25 MG tablet Take 1 tablet by mouth once daily as needed for 30 days 30 tablet 5   ALPRAZolam  (XANAX ) 0.25 MG tablet Take 1 tablet (0.25 mg total) by mouth daily as needed. 30 tablet 0   ALPRAZolam  (XANAX ) 0.25 MG tablet Take 1 tablet (0.25 mg total) by mouth daily as needed. 30 tablet 0   ALPRAZolam  (XANAX ) 0.25 MG tablet Take 1 tablet (0.25 mg total) by mouth daily as needed. 30 tablet 0   Cholecalciferol 50 MCG (2000 UT) TABS Take by mouth.     fluocinonide  gel (LIDEX ) 0.05 % Apply a small amount to affected area twice a day 30 g 2   FLUoxetine  (PROZAC ) 20 MG capsule Take 20 mg by mouth daily.     FLUoxetine  (PROZAC ) 20 MG capsule Take 1 capsule (20 mg total) by mouth daily. 90 capsule 3   FLUoxetine  (PROZAC ) 20 MG capsule Take 1 capsule by mouth once a day for 90 days 90 capsule 3   fluticasone  (FLONASE  ALLERGY RELIEF) 50 MCG/ACT nasal spray Use 1 spray in each nostril once a day for 30 days 16 g 11   triamterene -hydrochlorothiazide  (DYAZIDE ) 37.5-25 MG per capsule Take 1 capsule by mouth every morning.     triamterene -hydrochlorothiazide  (  MAXZIDE -25) 37.5-25 MG tablet Take 1 tablet by mouth in the morning. 90 tablet 3   No current facility-administered medications on file prior to visit.    LABS/IMAGING: No results found for this or any previous visit (from the past 48 hours). No results found.  LIPID PANEL: No results found for: CHOL, TRIG, HDL, CHOLHDL, VLDL, LDLCALC, LDLDIRECT  WEIGHTS: Wt Readings from Last 3 Encounters:  10/25/23 224 lb (101.6 kg)  12/07/18 228 lb (103.4 kg)  06/04/12 241 lb (109.3 kg)    VITALS: BP 132/70 (BP Location: Left Arm, Patient Position: Sitting, Cuff Size: Large)   Pulse (!) 106   Resp 16   Ht 5' 5 (1.651 m)   Wt 224 lb  (101.6 kg)   SpO2 96%   BMI 37.28 kg/m   EXAM: Deferred  EKG: EKG Interpretation Date/Time:  Wednesday October 25 2023 14:05:22 EST Ventricular Rate:  94 PR Interval:  148 QRS Duration:  84 QT Interval:  370 QTC Calculation: 462 R Axis:   5  Text Interpretation: Normal sinus rhythm Normal ECG When compared with ECG of 07-Dec-2018 12:33, QRS axis Shifted left Confirmed by Mona Kent [phone]) on 10/25/2023 4:47:36 PM    ASSESSMENT: Dyslipidemia with LDL greater than 190 CAC score of 27, 70th percentile (2023) Aortic atherosclerosis Statin intolerant-myalgias  PLAN: 1.   Ms. Veleta has a dyslipidemia with LDL greater than 190.  She has coronary calcification and aortic atherosclerosis but cannot tolerate statins.  She is a good candidate for PCSK9 inhibitor therapy.  Will pursue Repatha  with plan to repeat labs including NMR and LP(a) at about 3 to 4 months and follow-up at that time.  I did discuss the medication today including the risk, benefits and alternatives as well as use of the medication with her she is agreeable to proceed.  Thanks again for the kind referral.  Kent KYM Mona, MD, [facility]  [place]  [facility]  Medical Director of the Advanced Lipid Disorders &  Cardiovascular Risk Reduction Clinic Diplomate of the American Board of Clinical Lipidology Attending Cardiologist  Direct Dial: [phone]  Fax: [phone]  Website:  [email or web address] Kent JAYSON Mona 10/25/2023, 4:47 PM

## 2023-10-26 ENCOUNTER — Other Ambulatory Visit: Payer: Self-pay

## 2023-10-26 ENCOUNTER — Other Ambulatory Visit (HOSPITAL_COMMUNITY): Payer: Self-pay

## 2023-10-26 ENCOUNTER — Telehealth: Payer: Self-pay | Admitting: Pharmacy Technician

## 2023-10-26 ENCOUNTER — Encounter: Payer: Self-pay | Admitting: Pharmacist

## 2023-10-26 MED ORDER — REPATHA SURECLICK 140 MG/ML ~~LOC~~ SOAJ
1.0000 mL | SUBCUTANEOUS | 3 refills | Status: AC
  Filled 2023-10-26: qty 2, 28d supply, fill #0
  Filled 2023-11-20: qty 6, 84d supply, fill #1
  Filled 2024-02-20: qty 6, 84d supply, fill #2
  Filled 2024-05-15 – 2024-05-16 (×3): qty 6, 84d supply, fill #3
  Filled 2024-07-29: qty 4, 56d supply, fill #4

## 2023-10-26 NOTE — Telephone Encounter (Signed)
-----   Message from Nurse Andriette E sent at 10/25/2023  2:38 PM EST ----- Regarding: PA for Repatha  needed Hey team   This patient needs a PA for Repatha    Statin intolerant x4 Aortic atherosclerosis LDL >190 Elevated coronary artery calcium  score  Thanks

## 2023-10-26 NOTE — Addendum Note (Signed)
 Addended by: Lindell Spar on: 10/26/2023 11:49 AM   Modules accepted: Orders

## 2023-10-26 NOTE — Telephone Encounter (Signed)
 Update sent to patient via MyChart

## 2023-10-26 NOTE — Telephone Encounter (Signed)
 Pharmacy Patient Advocate Encounter   Received notification from Physician's Office that prior authorization for repatha  is required/requested.   Insurance verification completed.   The patient is insured through [facility] ADVANTAGE/RX ADVANCE .   Per test claim: PA required; PA submitted to above mentioned insurance via CoverMyMeds Key/confirmation #/EOC [id number] Status is pending

## 2023-10-26 NOTE — Telephone Encounter (Signed)
 Pharmacy Patient Advocate Encounter  Received notification from [employer] that Prior Authorization for repatha  has been APPROVED from 10/26/23 to 04/23/24. Ran test claim, Copay is $47.00 one month. This test claim was processed through [facility]- copay amounts may vary at other pharmacies due to pharmacy/plan contracts, or as the patient moves through the different stages of their insurance plan.   PA #/Case ID/Reference #: [id number]

## 2023-10-26 NOTE — Telephone Encounter (Signed)
 Rx(s) sent to pharmacy electronically.

## 2023-11-20 ENCOUNTER — Other Ambulatory Visit (HOSPITAL_COMMUNITY): Payer: Self-pay

## 2023-11-30 ENCOUNTER — Other Ambulatory Visit (HOSPITAL_COMMUNITY): Payer: Self-pay

## 2023-12-01 ENCOUNTER — Other Ambulatory Visit: Payer: Self-pay

## 2023-12-01 ENCOUNTER — Other Ambulatory Visit (HOSPITAL_COMMUNITY): Payer: Self-pay

## 2023-12-01 MED ORDER — FLUOXETINE HCL 20 MG PO CAPS
20.0000 mg | ORAL_CAPSULE | Freq: Every day | ORAL | 3 refills | Status: AC
  Filled 2023-12-02: qty 90, 90d supply, fill #0
  Filled 2024-07-29: qty 90, 90d supply, fill #1

## 2023-12-01 MED ORDER — ALPRAZOLAM 0.25 MG PO TABS
0.2500 mg | ORAL_TABLET | Freq: Every day | ORAL | 0 refills | Status: AC | PRN
  Filled 2023-12-01: qty 30, 30d supply, fill #0

## 2023-12-02 ENCOUNTER — Other Ambulatory Visit: Payer: Self-pay

## 2023-12-02 ENCOUNTER — Other Ambulatory Visit (HOSPITAL_COMMUNITY): Payer: Self-pay

## 2023-12-04 ENCOUNTER — Other Ambulatory Visit (HOSPITAL_COMMUNITY): Payer: Self-pay

## 2023-12-04 ENCOUNTER — Other Ambulatory Visit: Payer: Self-pay

## 2023-12-04 MED ORDER — TRIAMTERENE-HCTZ 37.5-25 MG PO TABS
1.0000 | ORAL_TABLET | Freq: Every morning | ORAL | 3 refills | Status: AC
  Filled 2023-12-04: qty 90, 90d supply, fill #0
  Filled 2024-07-29: qty 90, 90d supply, fill #1

## 2023-12-05 ENCOUNTER — Other Ambulatory Visit: Payer: Self-pay

## 2023-12-06 ENCOUNTER — Other Ambulatory Visit (HOSPITAL_COMMUNITY): Payer: Self-pay

## 2023-12-15 ENCOUNTER — Ambulatory Visit
Admission: RE | Admit: 2023-12-15 | Discharge: 2023-12-15 | Disposition: A | Discharge status: Home / Self Care | Payer: PPO | Source: Ambulatory Visit | Attending: Family Medicine | Admitting: Family Medicine

## 2023-12-15 DIAGNOSIS — E2839 Other primary ovarian failure: Secondary | ICD-10-CM

## 2023-12-18 ENCOUNTER — Institutional Professional Consult (permissible substitution) (HOSPITAL_BASED_OUTPATIENT_CLINIC_OR_DEPARTMENT_OTHER): Payer: Commercial Managed Care - PPO | Admitting: Internal Medicine

## 2024-01-10 ENCOUNTER — Other Ambulatory Visit: Payer: Self-pay | Admitting: Family Medicine

## 2024-01-10 DIAGNOSIS — Z1231 Encounter for screening mammogram for malignant neoplasm of breast: Secondary | ICD-10-CM

## 2024-01-17 ENCOUNTER — Ambulatory Visit
Admission: RE | Admit: 2024-01-17 | Discharge: 2024-01-17 | Disposition: A | Discharge status: Home / Self Care | Source: Ambulatory Visit | Attending: Family Medicine | Admitting: Family Medicine

## 2024-01-17 DIAGNOSIS — Z1231 Encounter for screening mammogram for malignant neoplasm of breast: Secondary | ICD-10-CM

## 2024-01-22 DIAGNOSIS — Z08 Encounter for follow-up examination after completed treatment for malignant neoplasm: Secondary | ICD-10-CM | POA: Diagnosis not present

## 2024-01-22 DIAGNOSIS — L821 Other seborrheic keratosis: Secondary | ICD-10-CM | POA: Diagnosis not present

## 2024-01-22 DIAGNOSIS — D225 Melanocytic nevi of trunk: Secondary | ICD-10-CM | POA: Diagnosis not present

## 2024-01-22 DIAGNOSIS — L814 Other melanin hyperpigmentation: Secondary | ICD-10-CM | POA: Diagnosis not present

## 2024-01-22 DIAGNOSIS — Z85828 Personal history of other malignant neoplasm of skin: Secondary | ICD-10-CM | POA: Diagnosis not present

## 2024-02-13 DIAGNOSIS — E7800 Pure hypercholesterolemia, unspecified: Secondary | ICD-10-CM | POA: Diagnosis not present

## 2024-02-14 LAB — NMR, LIPOPROFILE
Cholesterol, Total: 190 mg/dL (ref 100–199)
HDL Particle Number: 33.5 umol/L (ref >=30.5)
HDL-C: 46 mg/dL (ref >39)
LDL Particle Number: 1384 nmol/L — ABNORMAL HIGH (ref <1000)
LDL Size: 20.7 nm (ref >20.5)
LDL-C (NIH Calc): 115 mg/dL — ABNORMAL HIGH (ref 0–99)
LP-IR Score: 74 — ABNORMAL HIGH (ref <=45)
Small LDL Particle Number: 701 nmol/L — ABNORMAL HIGH (ref <=527)
Triglycerides: 165 mg/dL — ABNORMAL HIGH (ref 0–149)

## 2024-02-14 LAB — LIPOPROTEIN A (LPA): Lipoprotein (a): 208.6 nmol/L — ABNORMAL HIGH (ref <75.0)

## 2024-02-20 ENCOUNTER — Encounter (HOSPITAL_BASED_OUTPATIENT_CLINIC_OR_DEPARTMENT_OTHER): Payer: Self-pay | Admitting: Internal Medicine

## 2024-02-20 ENCOUNTER — Ambulatory Visit (HOSPITAL_BASED_OUTPATIENT_CLINIC_OR_DEPARTMENT_OTHER): Payer: Self-pay | Admitting: Internal Medicine

## 2024-02-20 ENCOUNTER — Other Ambulatory Visit (HOSPITAL_COMMUNITY): Payer: Self-pay

## 2024-02-20 VITALS — BP 134/70 | HR 91 | Ht 65.0 in | Wt 223.0 lb

## 2024-02-20 DIAGNOSIS — T466X5D Adverse effect of antihyperlipidemic and antiarteriosclerotic drugs, subsequent encounter: Secondary | ICD-10-CM | POA: Diagnosis not present

## 2024-02-20 DIAGNOSIS — E7800 Pure hypercholesterolemia, unspecified: Secondary | ICD-10-CM

## 2024-02-20 DIAGNOSIS — E7841 Elevated Lipoprotein(a): Secondary | ICD-10-CM | POA: Diagnosis not present

## 2024-02-20 DIAGNOSIS — M791 Myalgia, unspecified site: Secondary | ICD-10-CM | POA: Diagnosis not present

## 2024-02-20 DIAGNOSIS — I2510 Atherosclerotic heart disease of native coronary artery without angina pectoris: Secondary | ICD-10-CM | POA: Diagnosis not present

## 2024-02-20 NOTE — Progress Notes (Signed)
 LIPID CLINIC CONSULT NOTE  Chief Complaint:  Follow-up dyslipidemia  Primary Care Physician: Bufford Carne, FNP  Primary Cardiologist:  None  HPI:  Kristy Colon is a 68 y.o. female who is being seen today for the evaluation of dyslipidemia at the request of Bufford Carne, FNP.  This a pleasant 68 year old female kindly referred for evaluation and management of dyslipidemia.  She has a history of statin intolerance having previously tried simvastatin, atorvastatin , rosuvastatin  and pravastatin.  These all caused significant myalgias.  She does have a history of coronary artery calcification with a calcium  score of 27, 70th percentile for age and sex matched controls and aortic atherosclerosis back in 2023.  Her most recent lipids from August 2024 showed total cholesterol 286, HDL 41, triglycerides 256 and LDL 195.  This is concerning for possible familial hyperlipidemia although she did report her diet being higher and saturated fats.  02/20/2024  Ms. Kristy Colon returns today for follow-up of dyslipidemia.  She seems to be doing well on Repatha .  She does not note any significant side effects.  Her labs showed improvement in her lipids.  LDL particle #1384 with an LDL 115, HDL 46 triglycerides 165.  LP(a) is elevated at 208 nmol/L.  We discussed these findings today.  She might benefit from LP(a) lowering specific therapy in the future.  PMHx:  Past Medical History:  Diagnosis Date   Asthma    Hyperlipemia    Hypertension     Past Surgical History:  Procedure Laterality Date   ABDOMINAL HYSTERECTOMY     CHOLECYSTECTOMY      FAMHx:  Family History  Problem Relation Age of Onset   COPD Brother    Diabetes Other    Cancer Other     SOCHx:   reports that she has quit smoking. Her smoking use included cigarettes. She started smoking about 18 years ago. She has a 9.2 pack-year smoking history. She has never used smokeless tobacco. She reports that she does not drink alcohol  and does not use drugs.  ALLERGIES:  Allergies  Allergen Reactions   Statins     Simvastatin, Pravastatin, Rosuvastatin , Atorvastatin  - myalgias    ROS: Pertinent items noted in HPI and remainder of comprehensive ROS otherwise negative.  HOME MEDS: Current Outpatient Medications on File Prior to Visit  Medication Sig Dispense Refill   albuterol  (PROAIR  HFA) 108 (90 Base) MCG/ACT inhaler Inhale 2 puffs every 4 hours if needed for wheezing 30 days 6.7 g 1   ALPRAZolam  (XANAX ) 0.25 MG tablet Take 1 tablet (0.25 mg total) by mouth once daily as needed. 30 tablet 0   Evolocumab  (REPATHA  SURECLICK) 140 MG/ML SOAJ Inject 140 mg into the skin every 14 (fourteen) days. 6 mL 3   fluocinonide  gel (LIDEX ) 0.05 % Apply a small amount to affected area twice a day 30 g 2   FLUoxetine  (PROZAC ) 20 MG capsule Take 1 capsule (20 mg total) by mouth daily. 90 capsule 3   fluticasone  (FLONASE  ALLERGY RELIEF) 50 MCG/ACT nasal spray Use 1 spray in each nostril once a day for 30 days 16 g 11   triamterene -hydrochlorothiazide  (MAXZIDE -25) 37.5-25 MG tablet Take 1 tablet by mouth in the morning. 90 tablet 3   albuterol  (PROAIR  HFA) 108 (90 Base) MCG/ACT inhaler Inhale 2 puffs into the lungs every 4 hours if needed for wheezing 18 g 0   ALPRAZolam  (XANAX ) 0.25 MG tablet Take 1 tablet by mouth once daily as needed for 30 days 30  tablet 5   ALPRAZolam  (XANAX ) 0.25 MG tablet Take 1 tablet (0.25 mg total) by mouth daily as needed. 30 tablet 0   ALPRAZolam  (XANAX ) 0.25 MG tablet Take 1 tablet (0.25 mg total) by mouth daily as needed. 30 tablet 0   Cholecalciferol 50 MCG (2000 UT) TABS Take by mouth.     FLUoxetine  (PROZAC ) 20 MG capsule Take 20 mg by mouth daily.     FLUoxetine  (PROZAC ) 20 MG capsule Take 1 capsule (20 mg total) by mouth daily. 90 capsule 3   triamterene -hydrochlorothiazide  (DYAZIDE ) 37.5-25 MG per capsule Take 1 capsule by mouth every morning.     No current facility-administered medications on file  prior to visit.    LABS/IMAGING: No results found for this or any previous visit (from the past 48 hours). No results found.  LIPID PANEL: No results found for: "CHOL", "TRIG", "HDL", "CHOLHDL", "VLDL", "LDLCALC", "LDLDIRECT"  WEIGHTS: Wt Readings from Last 3 Encounters:  02/20/24 223 lb (101.2 kg)  10/25/23 224 lb (101.6 kg)  12/07/18 228 lb (103.4 kg)    VITALS: BP 134/70   Pulse 91   Ht 5\' 5"  (1.651 m)   Wt 223 lb (101.2 kg)   SpO2 97%   BMI 37.11 kg/m   EXAM: Deferred  EKG: Deferred  ASSESSMENT: Dyslipidemia with LDL greater than 190 CAC score of 27, 70th percentile (2023) Aortic atherosclerosis Statin intolerant-myalgias Elevated LP(a)-2 or 8 nmol/L  PLAN: 1.   Ms. Kristy Colon has had improvement in her lipids with LDL now 115.  LP(a) is elevated at 208 nmol/L.  For now we will plan to continue her current therapies although I think she would benefit from additional therapy such as ezetimibe.  She wants to continue to work with diet and exercise modification.  Plan repeat lab work in about 4 months.  Hazle Lites, MD, [facility], FNLA, FACP  [place]  [facility]  Medical Director of the Advanced Lipid Disorders &  Cardiovascular Risk Reduction Clinic Diplomate of the American Board of Clinical Lipidology Attending Cardiologist  Direct Dial: [phone]  Fax: [phone]  Website:  [email or web address]   Aviva Lemmings Candice Lunney 02/20/2024, 2:25 PM

## 2024-02-20 NOTE — Patient Instructions (Signed)
 Medication Instructions:  NO CHANGES  *If you need a refill on your cardiac medications before your next appointment, please call your pharmacy*  Lab Work: FASTING lab work in 4 months  If you have labs (blood work) drawn today and your tests are completely normal, you will receive your results only by: [employer] (if you have MyChart) OR A paper copy in the mail If you have any lab test that is abnormal or we need to change your treatment, we will call you to review the results.  Follow-Up: At [facility], you and your health needs are our priority.  As part of our continuing mission to provide you with exceptional heart care, our providers are all part of one team.  This team includes your primary Cardiologist (physician) and Advanced Practice Providers or APPs (Physician Assistants and Nurse Practitioners) who all work together to provide you with the care you need, when you need it.  Your next appointment:    AS NEEDED, pending lab results  We recommend signing up for the patient portal called "MyChart".  Sign up information is provided on this After Visit Summary.  MyChart is used to connect with patients for Virtual Visits (Telemedicine).  Patients are able to view lab/test results, encounter notes, upcoming appointments, etc.  Non-urgent messages can be sent to your provider as well.   To learn more about what you can do with MyChart, go to [email or web address].

## 2024-02-21 ENCOUNTER — Other Ambulatory Visit (HOSPITAL_COMMUNITY): Payer: Self-pay

## 2024-05-15 ENCOUNTER — Other Ambulatory Visit (HOSPITAL_COMMUNITY): Payer: Self-pay

## 2024-05-15 ENCOUNTER — Telehealth: Payer: Self-pay | Admitting: Pharmacy Technician

## 2024-05-15 ENCOUNTER — Telehealth (HOSPITAL_COMMUNITY): Payer: Self-pay

## 2024-05-15 NOTE — Telephone Encounter (Signed)
 Pharmacy Patient Advocate Encounter   Received notification from Pt Calls Messages that prior authorization for Repatha  is required/requested.   Insurance verification completed.   The patient is insured through [facility] ADVANTAGE/RX ADVANCE .   Per test claim: PA required; PA submitted to above mentioned insurance via CoverMyMeds Key/confirmation #/EOC [id number] Status is pending

## 2024-05-16 ENCOUNTER — Other Ambulatory Visit (HOSPITAL_COMMUNITY): Payer: Self-pay

## 2024-05-16 ENCOUNTER — Other Ambulatory Visit: Payer: Self-pay

## 2024-05-16 NOTE — Telephone Encounter (Signed)
 Pharmacy Patient Advocate Encounter  Received notification from [employer] that Prior Authorization for Repatha  has been APPROVED from 05/15/24 to 05/15/25. Ran test claim, Copay is $47.00- one month. This test claim was processed through [facility]- copay amounts may vary at other pharmacies due to pharmacy/plan contracts, or as the patient moves through the different stages of their insurance plan.   PA #/Case ID/Reference #: [id number]

## 2024-06-06 ENCOUNTER — Other Ambulatory Visit: Payer: Self-pay

## 2024-06-06 ENCOUNTER — Other Ambulatory Visit (HOSPITAL_COMMUNITY): Payer: Self-pay

## 2024-06-06 DIAGNOSIS — M545 Low back pain, unspecified: Secondary | ICD-10-CM | POA: Diagnosis not present

## 2024-06-06 MED ORDER — PREDNISONE 5 MG (21) PO TBPK
ORAL_TABLET | ORAL | 1 refills | Status: DC
  Filled 2024-06-06 (×2): qty 21, 6d supply, fill #0
  Filled 2024-07-14 – 2024-07-15 (×2): qty 21, 6d supply, fill #1

## 2024-06-20 DIAGNOSIS — E782 Mixed hyperlipidemia: Secondary | ICD-10-CM | POA: Diagnosis not present

## 2024-06-20 DIAGNOSIS — Z0000 Encounter for general adult medical examination without abnormal findings: Secondary | ICD-10-CM | POA: Diagnosis not present

## 2024-06-20 DIAGNOSIS — Z1159 Encounter for screening for other viral diseases: Secondary | ICD-10-CM | POA: Diagnosis not present

## 2024-06-20 DIAGNOSIS — E559 Vitamin D deficiency, unspecified: Secondary | ICD-10-CM | POA: Diagnosis not present

## 2024-06-20 DIAGNOSIS — I10 Essential (primary) hypertension: Secondary | ICD-10-CM | POA: Diagnosis not present

## 2024-06-20 DIAGNOSIS — R7303 Prediabetes: Secondary | ICD-10-CM | POA: Diagnosis not present

## 2024-06-20 DIAGNOSIS — E7800 Pure hypercholesterolemia, unspecified: Secondary | ICD-10-CM | POA: Diagnosis not present

## 2024-06-21 ENCOUNTER — Other Ambulatory Visit (HOSPITAL_COMMUNITY): Payer: Self-pay

## 2024-06-21 ENCOUNTER — Other Ambulatory Visit: Payer: Self-pay

## 2024-06-21 ENCOUNTER — Ambulatory Visit: Payer: Self-pay | Admitting: Internal Medicine

## 2024-06-21 DIAGNOSIS — Z0000 Encounter for general adult medical examination without abnormal findings: Secondary | ICD-10-CM | POA: Diagnosis not present

## 2024-06-21 DIAGNOSIS — F419 Anxiety disorder, unspecified: Secondary | ICD-10-CM | POA: Diagnosis not present

## 2024-06-21 DIAGNOSIS — E782 Mixed hyperlipidemia: Secondary | ICD-10-CM | POA: Diagnosis not present

## 2024-06-21 DIAGNOSIS — N909 Noninflammatory disorder of vulva and perineum, unspecified: Secondary | ICD-10-CM | POA: Diagnosis not present

## 2024-06-21 DIAGNOSIS — I700 Atherosclerosis of aorta: Secondary | ICD-10-CM | POA: Diagnosis not present

## 2024-06-21 DIAGNOSIS — R931 Abnormal findings on diagnostic imaging of heart and coronary circulation: Secondary | ICD-10-CM | POA: Diagnosis not present

## 2024-06-21 DIAGNOSIS — I10 Essential (primary) hypertension: Secondary | ICD-10-CM | POA: Diagnosis not present

## 2024-06-21 DIAGNOSIS — R7303 Prediabetes: Secondary | ICD-10-CM | POA: Diagnosis not present

## 2024-06-21 DIAGNOSIS — E559 Vitamin D deficiency, unspecified: Secondary | ICD-10-CM | POA: Diagnosis not present

## 2024-06-21 DIAGNOSIS — M25559 Pain in unspecified hip: Secondary | ICD-10-CM | POA: Diagnosis not present

## 2024-06-21 DIAGNOSIS — Z23 Encounter for immunization: Secondary | ICD-10-CM | POA: Diagnosis not present

## 2024-06-21 LAB — NMR, LIPOPROFILE
Cholesterol, Total: 202 mg/dL — ABNORMAL HIGH (ref 100–199)
HDL Particle Number: 32.8 umol/L (ref >=30.5)
HDL-C: 45 mg/dL (ref >39)
LDL Particle Number: 1385 nmol/L — ABNORMAL HIGH (ref <1000)
LDL Size: 20.3 nm — ABNORMAL LOW (ref >20.5)
LDL-C (NIH Calc): 116 mg/dL — ABNORMAL HIGH (ref 0–99)
LP-IR Score: 82 — ABNORMAL HIGH (ref <=45)
Small LDL Particle Number: 821 nmol/L — ABNORMAL HIGH (ref <=527)
Triglycerides: 234 mg/dL — ABNORMAL HIGH (ref 0–149)

## 2024-06-21 MED ORDER — MELOXICAM 15 MG PO TABS
15.0000 mg | ORAL_TABLET | Freq: Every day | ORAL | 3 refills | Status: AC
  Filled 2024-06-21: qty 60, 60d supply, fill #0

## 2024-06-24 ENCOUNTER — Other Ambulatory Visit: Payer: Self-pay

## 2024-06-24 ENCOUNTER — Other Ambulatory Visit (HOSPITAL_COMMUNITY): Payer: Self-pay

## 2024-06-24 MED ORDER — EZETIMIBE 10 MG PO TABS
10.0000 mg | ORAL_TABLET | Freq: Every day | ORAL | 3 refills | Status: AC
  Filled 2024-06-24: qty 90, 90d supply, fill #0

## 2024-07-07 ENCOUNTER — Telehealth: Admitting: Family

## 2024-07-07 DIAGNOSIS — J069 Acute upper respiratory infection, unspecified: Secondary | ICD-10-CM | POA: Diagnosis not present

## 2024-07-07 MED ORDER — FLUTICASONE PROPIONATE 50 MCG/ACT NA SUSP: 2.0000 | Freq: Every day | NASAL | 6 refills | Status: AC

## 2024-07-07 MED ORDER — CETIRIZINE HCL 10 MG PO TABS: 10.0000 mg | ORAL_TABLET | Freq: Every day | ORAL | 1 refills | Status: AC

## 2024-07-07 MED ORDER — BENZONATATE 100 MG PO CAPS: 100.0000 mg | ORAL_CAPSULE | Freq: Three times a day (TID) | ORAL | 0 refills | Status: AC | PRN

## 2024-07-07 NOTE — Progress Notes (Signed)
Approximately 5 minutes was spent documenting and reviewing patient's chart.

## 2024-07-07 NOTE — Progress Notes (Signed)

## 2024-07-15 ENCOUNTER — Other Ambulatory Visit: Payer: Self-pay

## 2024-07-15 ENCOUNTER — Other Ambulatory Visit (HOSPITAL_COMMUNITY): Payer: Self-pay

## 2024-07-23 DIAGNOSIS — L814 Other melanin hyperpigmentation: Secondary | ICD-10-CM | POA: Diagnosis not present

## 2024-07-23 DIAGNOSIS — Z08 Encounter for follow-up examination after completed treatment for malignant neoplasm: Secondary | ICD-10-CM | POA: Diagnosis not present

## 2024-07-23 DIAGNOSIS — D1801 Hemangioma of skin and subcutaneous tissue: Secondary | ICD-10-CM | POA: Diagnosis not present

## 2024-07-23 DIAGNOSIS — L821 Other seborrheic keratosis: Secondary | ICD-10-CM | POA: Diagnosis not present

## 2024-07-23 DIAGNOSIS — Z85828 Personal history of other malignant neoplasm of skin: Secondary | ICD-10-CM | POA: Diagnosis not present

## 2024-07-29 ENCOUNTER — Other Ambulatory Visit (HOSPITAL_COMMUNITY): Payer: Self-pay

## 2024-07-29 ENCOUNTER — Other Ambulatory Visit: Payer: Self-pay

## 2024-07-29 MED ORDER — ALBUTEROL SULFATE HFA 108 (90 BASE) MCG/ACT IN AERS
2.0000 | INHALATION_SPRAY | RESPIRATORY_TRACT | 1 refills | Status: AC | PRN
  Filled 2024-07-29: qty 6.7, 17d supply, fill #0

## 2024-07-30 ENCOUNTER — Other Ambulatory Visit: Payer: Self-pay

## 2024-07-30 ENCOUNTER — Other Ambulatory Visit (HOSPITAL_COMMUNITY): Payer: Self-pay

## 2024-08-20 ENCOUNTER — Encounter (HOSPITAL_BASED_OUTPATIENT_CLINIC_OR_DEPARTMENT_OTHER): Payer: Self-pay | Admitting: Internal Medicine

## 2024-08-21 ENCOUNTER — Other Ambulatory Visit (HOSPITAL_COMMUNITY): Payer: Self-pay

## 2024-08-21 ENCOUNTER — Other Ambulatory Visit: Payer: Self-pay

## 2024-08-21 DIAGNOSIS — M545 Low back pain, unspecified: Secondary | ICD-10-CM | POA: Diagnosis not present

## 2024-08-21 MED ORDER — TIZANIDINE HCL 2 MG PO CAPS
2.0000 mg | ORAL_CAPSULE | Freq: Three times a day (TID) | ORAL | 1 refills | Status: AC | PRN
  Filled 2024-08-21 (×2): qty 30, 10d supply, fill #0

## 2024-08-21 MED ORDER — PREDNISONE 5 MG (21) PO TBPK
ORAL_TABLET | ORAL | 1 refills | Status: AC
  Filled 2024-08-21 (×2): qty 21, 6d supply, fill #0
  Filled 2024-10-15: qty 21, 6d supply, fill #1

## 2024-08-22 ENCOUNTER — Other Ambulatory Visit: Payer: Self-pay

## 2024-10-16 ENCOUNTER — Other Ambulatory Visit (HOSPITAL_COMMUNITY): Payer: Self-pay
